# Patient Record
Sex: Female | Born: 1945 | Race: White | Hispanic: No | Smoking: Never smoker
Health system: Southern US, Community
[De-identification: ages and names within clinical notes are randomized; demographics above are authoritative.]

## PROBLEM LIST (undated history)

## (undated) DIAGNOSIS — J45909 Unspecified asthma, uncomplicated: Secondary | ICD-10-CM

## (undated) DIAGNOSIS — M8580 Other specified disorders of bone density and structure, unspecified site: Secondary | ICD-10-CM

## (undated) DIAGNOSIS — Z8619 Personal history of other infectious and parasitic diseases: Secondary | ICD-10-CM

## (undated) HISTORY — DX: Unspecified asthma, uncomplicated: J45.909

## (undated) HISTORY — DX: Personal history of other infectious and parasitic diseases: Z86.19

## (undated) HISTORY — DX: Other specified disorders of bone density and structure, unspecified site: M85.80

---

## 1983-06-08 HISTORY — PX: TUBAL LIGATION: SHX77

## 2012-11-02 ENCOUNTER — Ambulatory Visit (INDEPENDENT_AMBULATORY_CARE_PROVIDER_SITE_OTHER): Payer: BC Managed Care – PPO | Admitting: Physician Assistant

## 2012-11-02 VITALS — BP 128/84 | HR 116 | Temp 97.8°F | Resp 18 | Ht 62.0 in | Wt 172.8 lb

## 2012-11-02 DIAGNOSIS — J40 Bronchitis, not specified as acute or chronic: Secondary | ICD-10-CM

## 2012-11-02 DIAGNOSIS — J209 Acute bronchitis, unspecified: Secondary | ICD-10-CM

## 2012-11-02 MED ORDER — ALBUTEROL SULFATE HFA 108 (90 BASE) MCG/ACT IN AERS: 2.0000 | INHALATION_SPRAY | Freq: Four times a day (QID) | RESPIRATORY_TRACT | Status: DC | PRN

## 2012-11-02 MED ORDER — HYDROCOD POLST-CHLORPHEN POLST 10-8 MG/5ML PO LQCR: 5.0000 mL | Freq: Two times a day (BID) | ORAL | Status: DC

## 2012-11-02 MED ORDER — GUAIFENESIN ER 1200 MG PO TB12: 1.0000 | ORAL_TABLET | Freq: Two times a day (BID) | ORAL | Status: AC

## 2012-11-02 NOTE — Progress Notes (Signed)
   [address] [place] [address]   Phone [phone]  Subjective:    Patient ID: Breanna Miller, female    DOB: [birth year], 67 y.o.   MRN: [id number]  HPI Pt presents to clinic with 6 day h/o cold symptoms. She drove to [facility] for a graduation and got sick while she was there.  Her illnesses do not typically take this long to resolve so she is a little concerned.  She is up at night coughing.  She has lost her voice. Her ribcage is sore from all the coughing.  She is wheezing at night at time, though she does not have a diagnoses of asthma she has had to have an inhaler before.   OTC - herbal cold preps  OTC- dayquil,nyquil, zyrtec, mucinex  Review of Systems  HENT: Positive for congestion, sore throat, voice change and postnasal drip. Negative for rhinorrhea.   Respiratory: Positive for cough (mostly dry cough) and wheezing.        Objective:   Physical Exam  Vitals reviewed. Constitutional: She is oriented to person, place, and time. She appears well-developed and well-nourished.  HENT:  Head: Normocephalic and atraumatic.  Right Ear: External ear normal.  Left Ear: External ear normal.  Eyes: Conjunctivae are normal.  Neck: Normal range of motion. Neck supple.  Cardiovascular: Normal rate, regular rhythm and normal heart sounds.   No murmur heard. Pulmonary/Chest: Effort normal. She has wheezes (end expiratory wheezing at L base with forced expiration).  Lymphadenopathy:    She has no cervical adenopathy.  Neurological: She is alert and oriented to person, place, and time.  Skin: Skin is warm and dry.  Psychiatric: She has a normal mood and affect. Her behavior is normal. Judgment and thought content normal.          Assessment & Plan:  Bronchitis with bronchospasm - Plan: chlorpheniramine-HYDROcodone (TUSSIONEX PENNKINETIC ER) 10-8 MG/5ML LQCR, albuterol (PROVENTIL HFA;VENTOLIN HFA) 108 (90 BASE) MCG/ACT inhaler, Guaifenesin (MUCINEX MAXIMUM STRENGTH) 1200 MG TB12  Pt  to push fluids.  She will call me in 5 days if she is not any better we will call in an abx.  Benny Lennert PA-C 11/02/2012 5:29 PM

## 2012-11-04 ENCOUNTER — Telehealth: Payer: Self-pay

## 2012-11-04 NOTE — Telephone Encounter (Signed)
Pt was here this week w/ bronchitis. She is feeling somewhat better, but the cough med only lasts for about 6 hrs and she is only to take it every 12 hrs.  Luis Abed told her to call back if not better.

## 2012-11-05 MED ORDER — AZITHROMYCIN 250 MG PO TABS: ORAL_TABLET | ORAL | Status: DC

## 2012-11-05 NOTE — Telephone Encounter (Signed)
Please call this patient.  How often is she using the albuterol inhaler?

## 2012-11-05 NOTE — Telephone Encounter (Signed)
Spoke with patient and she is using albuterol inhaler q6 hrs.

## 2012-11-05 NOTE — Telephone Encounter (Signed)
Patient notified and voiced understanding.

## 2012-11-05 NOTE — Telephone Encounter (Signed)
She can increase the albuterol to Q4 hours. I've sent in an antibiotic that she can begin today. If she continues to have inadequate relief of the cough, she should RTC for re-evaluation.  Meds ordered this encounter  Medications  . azithromycin (ZITHROMAX) 250 MG tablet    Sig: Take 2 tabs PO x 1 dose, then 1 tab PO QD x 4 days    Dispense:  6 tablet    Refill:  0    Order Specific Question:  Supervising Provider    Answer:  DOOLITTLE, ROBERT P [[id number]]

## 2012-11-06 ENCOUNTER — Encounter: Payer: Self-pay | Admitting: Family Medicine

## 2012-11-06 ENCOUNTER — Ambulatory Visit (INDEPENDENT_AMBULATORY_CARE_PROVIDER_SITE_OTHER): Payer: BC Managed Care – PPO | Admitting: Family Medicine

## 2012-11-06 DIAGNOSIS — J45909 Unspecified asthma, uncomplicated: Secondary | ICD-10-CM

## 2012-11-06 DIAGNOSIS — J209 Acute bronchitis, unspecified: Secondary | ICD-10-CM | POA: Insufficient documentation

## 2012-11-06 DIAGNOSIS — J45901 Unspecified asthma with (acute) exacerbation: Secondary | ICD-10-CM

## 2012-11-06 DIAGNOSIS — J40 Bronchitis, not specified as acute or chronic: Secondary | ICD-10-CM

## 2012-11-06 MED ORDER — HYDROCOD POLST-CHLORPHEN POLST 10-8 MG/5ML PO LQCR: 5.0000 mL | Freq: Two times a day (BID) | ORAL | Status: DC

## 2012-11-06 MED ORDER — BENZONATATE 100 MG PO CAPS: 100.0000 mg | ORAL_CAPSULE | Freq: Three times a day (TID) | ORAL | Status: DC | PRN

## 2012-11-06 NOTE — Patient Instructions (Signed)
Good to see you today, call us with questions. Return in 3-6 months fasting for blood work and afterwards for physical. Take tessalon perls as needed throughout the day along with tussionex cough syrup. Schedule albuterol every 4-6 hours for next 3 days then may slowly taper down if improving. If not improving, call me for steroid course. Fill zpack at pharmacy.

## 2012-11-06 NOTE — Assessment & Plan Note (Signed)
Some improvement noted albeit slow. Recommended schedule albuterol every 4-6 hours for next 3 days Update [place] if not improving as expected for prednisone course. refilled tussionex and filled tessalon perls. rec pick up zpack at home.

## 2012-11-06 NOTE — Progress Notes (Signed)
Subjective:    Patient ID: Breanna Miller, female    DOB: [birth year], 67 y.o.   MRN: [id number]  HPI CC: new pt to establish  Recently moved from [place].  Recently seen by [place] with dx bronchitis with bronchospasm.  Treated with tussionex, albuterol and mucinex.  Did not improve so called back and prescribed azithromycin which she has not yet started.  Sxs ongoing for 10 days.  Persistent cough intermittently productive.  Cough worse late at night and first thing in am.  Wheezing improved with albuterol.  Improving dyspnea.  Fatigued.    H/o asthma - usually well controlled.  Notes triggered with pollen and URI.  Preventative: CPE >1 yr ago Well woman with OBGYN - would like to establish.  Last pap was 2004.  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  Past histories reviewed and updated if relevant as below. There are no active problems to display for this patient.  Past Medical History  Diagnosis Date  . Asthma   . History of chicken pox   . History of measles   . History of mumps    Past Surgical History  Procedure Laterality Date  . Tubal ligation  1985   History  Substance Use Topics  . Smoking status: Never Smoker   . Smokeless tobacco: Never Used  . Alcohol Use: Yes     Comment: Very rare   Family History  Problem Relation Age of Onset  . Heart disease Mother   . Hypertension Mother   . CAD Father 30    MI (smoker)  . Hypertension Sister   . Asthma Son     smoker  . Cancer Brother     prostate and bladder (nonsmoker)  . Asthma Father   . Alcohol abuse Father   . Diabetes Neg Hx    No Known Allergies Current Outpatient Prescriptions on File Prior to Visit  Medication Sig Dispense Refill  . albuterol (PROVENTIL HFA;VENTOLIN HFA)  108 (90 BASE) MCG/ACT inhaler Inhale 2 puffs into the lungs every 6 (six) hours as needed for wheezing.  1 Inhaler  0  . chlorpheniramine-HYDROcodone (TUSSIONEX PENNKINETIC ER) 10-8 MG/5ML LQCR Take 5 mLs by mouth every 12 (twelve) hours.  70 mL  0  . Guaifenesin (MUCINEX MAXIMUM STRENGTH) 1200 MG TB12 Take 1 tablet (1,200 mg total) by mouth 2 (two) times daily.  14 each  0  . azithromycin (ZITHROMAX) 250 MG tablet Take 2 tabs PO x 1 dose, then 1 tab PO QD x 4 days  6 tablet  0   No current facility-administered medications on file prior to visit.     Review of Systems  Constitutional: Positive for appetite change (with illness). Negative for fever (mild), chills, activity change, fatigue and unexpected weight change.  HENT: Positive for congestion, sore throat, sneezing and sinus pressure. Negative for hearing loss and neck pain.   Eyes: Negative for visual disturbance.  Respiratory: Positive for cough, shortness of breath and wheezing. Negative for chest tightness.   Cardiovascular: Negative for chest pain, palpitations and leg swelling.  Gastrointestinal: Positive for vomiting (post tusive emesis). Negative for nausea, abdominal pain, diarrhea, constipation, blood in stool and abdominal distention.  Genitourinary: Negative for hematuria and difficulty urinating.  Musculoskeletal: Negative for myalgias and arthralgias.  Skin: Negative for rash.  Neurological: Positive for headaches (with recent illness). Negative for dizziness, seizures and syncope.  Hematological: Negative for adenopathy. Does not bruise/bleed easily.  Psychiatric/Behavioral: Negative for dysphoric mood. The patient is not nervous/anxious.        Objective:   Physical Exam  Nursing note and vitals reviewed. Constitutional: She is oriented to person, place, and time. She appears well-developed and well-nourished. No distress.  HENT:  Head: Normocephalic and atraumatic.  Right Ear: External ear normal.  Left Ear:  External ear normal.  Nose: Nose normal. No mucosal edema or rhinorrhea. Right sinus exhibits no maxillary sinus tenderness and no frontal sinus tenderness. Left sinus exhibits no maxillary sinus tenderness and no frontal sinus tenderness.  Mouth/Throat: Uvula is midline, oropharynx is clear and moist and mucous membranes are normal. No oropharyngeal exudate.  Eyes: Conjunctivae and EOM are normal. Pupils are equal, round, and reactive to light. No scleral icterus.  Neck: Normal range of motion. Neck supple.  Cardiovascular: Normal rate, regular rhythm, normal heart sounds and intact distal pulses.   No murmur heard. Pulses:      Radial pulses are 2+ on the right side, and 2+ on the left side.  Pulmonary/Chest: Effort normal. No respiratory distress. She has wheezes (exp wheezing L>R). She has no rales.  Musculoskeletal: Normal range of motion. She exhibits no edema.  Lymphadenopathy:    She has no cervical adenopathy.  Neurological: She is alert and oriented to person, place, and time.  CN grossly intact, station and gait intact  Skin: Skin is warm and dry. No rash noted.  Psychiatric: She has a normal mood and affect. Her behavior is normal. Judgment and thought content normal.       Assessment & Plan:

## 2012-11-21 ENCOUNTER — Ambulatory Visit: Payer: Self-pay | Admitting: Family Medicine

## 2013-03-21 ENCOUNTER — Ambulatory Visit (INDEPENDENT_AMBULATORY_CARE_PROVIDER_SITE_OTHER): Payer: Medicare Other

## 2013-03-21 DIAGNOSIS — Z23 Encounter for immunization: Secondary | ICD-10-CM

## 2013-05-22 ENCOUNTER — Ambulatory Visit (INDEPENDENT_AMBULATORY_CARE_PROVIDER_SITE_OTHER): Payer: Medicare Other | Admitting: Family Medicine

## 2013-05-22 ENCOUNTER — Encounter: Payer: Self-pay | Admitting: Family Medicine

## 2013-05-22 VITALS — BP 148/90 | HR 86 | Temp 98.1°F | Ht 62.75 in | Wt 180.5 lb

## 2013-05-22 DIAGNOSIS — J4520 Mild intermittent asthma, uncomplicated: Secondary | ICD-10-CM | POA: Insufficient documentation

## 2013-05-22 DIAGNOSIS — J45901 Unspecified asthma with (acute) exacerbation: Secondary | ICD-10-CM

## 2013-05-22 MED ORDER — ALBUTEROL SULFATE HFA 108 (90 BASE) MCG/ACT IN AERS: 2.0000 | INHALATION_SPRAY | Freq: Four times a day (QID) | RESPIRATORY_TRACT | Status: DC | PRN

## 2013-05-22 MED ORDER — PREDNISONE 20 MG PO TABS: ORAL_TABLET | ORAL | Status: DC

## 2013-05-22 MED ORDER — ALBUTEROL SULFATE (2.5 MG/3ML) 0.083% IN NEBU
2.5000 mg | INHALATION_SOLUTION | Freq: Once | RESPIRATORY_TRACT | Status: AC
  Administered 2013-05-22: 2.5 mg via RESPIRATORY_TRACT

## 2013-05-22 MED ORDER — BENZONATATE 100 MG PO CAPS: 100.0000 mg | ORAL_CAPSULE | Freq: Three times a day (TID) | ORAL | Status: DC | PRN

## 2013-05-22 NOTE — Progress Notes (Signed)
   Subjective:    Patient ID: Breanna Miller, female    DOB: Feb 19, 1946, 67 y.o.   MRN: [id number]  HPI CC: cough   1wk h/o cough worse at night.  Overall dry cough, some rattling in chest however.  + wheezing and chest tightness.  Marked dyspnea with exertion after walking up a flight of stairs, decreased stamina.  Mild ST.    Taking nyquil as well as using albuterol nebulizer (husband's) which has helped.  No fevers/chills, abd pain, ear or tooth pain.  Sinuses not congested.  husband sick recently Did receive flu shot. No smokers at home.  Never somker H/o asthma - last flare was several months ago.    Past Medical History  Diagnosis Date  . Asthma   . History of chicken pox   . History of measles   . History of mumps      Review of Systems Per HPI    Objective:   Physical Exam  Nursing note and vitals reviewed. Constitutional: She appears well-developed and well-nourished. No distress.  HENT:  Head: Normocephalic and atraumatic.  Right Ear: Hearing, tympanic membrane, external ear and ear canal normal.  Left Ear: Hearing, tympanic membrane, external ear and ear canal normal.  Nose: Nose normal. No mucosal edema or rhinorrhea. Right sinus exhibits no maxillary sinus tenderness and no frontal sinus tenderness. Left sinus exhibits no maxillary sinus tenderness and no frontal sinus tenderness.  Mouth/Throat: Uvula is midline, oropharynx is clear and moist and mucous membranes are normal. No oropharyngeal exudate, posterior oropharyngeal edema, posterior oropharyngeal erythema or tonsillar abscesses.  Eyes: Conjunctivae and EOM are normal. Pupils are equal, round, and reactive to light. No scleral icterus.  Neck: Normal range of motion. Neck supple.  Cardiovascular: Normal rate, regular rhythm, normal heart sounds and intact distal pulses.   No murmur heard. Pulmonary/Chest: Effort normal. No respiratory distress. She has wheezes. She has no rales.  Diffuse polymelodic wheezing  throughout lungs, preserved air movement  Lymphadenopathy:    She has no cervical adenopathy.  Skin: Skin is warm and dry. No rash noted.   After neb - improved air movement, some persistent expiratory wheezing     Assessment & Plan:

## 2013-05-22 NOTE — Progress Notes (Signed)
Pre-visit discussion using our clinic review tool. No additional management support is needed unless otherwise documented below in the visit note.  

## 2013-05-22 NOTE — Assessment & Plan Note (Signed)
Doubt active infection currently, do think she has asthma exacerbation. Treat with alb neb in office, then alb inh scheduled for next 3 days along with prednisone course. Update if sxs persist or fail to improve. Consider controller med for this. Discussed importance of return for further eval/xray if sxs not improved with above treatment.

## 2013-05-22 NOTE — Patient Instructions (Signed)
I do think you have asthma exacerbation - treat with albuterol nebulizer in office. Use albuterol inhaler regularly - 2 puffs every 6 hours for 3 days then as needed Start prednisone course. Let us know if not improving with this for further evaluation.

## 2014-02-28 ENCOUNTER — Encounter: Payer: Self-pay | Admitting: Family Medicine

## 2014-02-28 ENCOUNTER — Ambulatory Visit (INDEPENDENT_AMBULATORY_CARE_PROVIDER_SITE_OTHER): Payer: Medicare Other | Admitting: Family Medicine

## 2014-02-28 VITALS — BP 128/86 | HR 94 | Temp 98.2°F | Wt 183.5 lb

## 2014-02-28 DIAGNOSIS — J45901 Unspecified asthma with (acute) exacerbation: Secondary | ICD-10-CM

## 2014-02-28 DIAGNOSIS — J4521 Mild intermittent asthma with (acute) exacerbation: Secondary | ICD-10-CM

## 2014-02-28 MED ORDER — IPRATROPIUM BROMIDE 0.02 % IN SOLN
0.5000 mg | Freq: Once | RESPIRATORY_TRACT | Status: AC
  Administered 2014-02-28: 0.5 mg via RESPIRATORY_TRACT

## 2014-02-28 MED ORDER — ALBUTEROL SULFATE HFA 108 (90 BASE) MCG/ACT IN AERS: 2.0000 | INHALATION_SPRAY | Freq: Four times a day (QID) | RESPIRATORY_TRACT | Status: AC | PRN

## 2014-02-28 MED ORDER — PREDNISONE 20 MG PO TABS: ORAL_TABLET | ORAL | Status: DC

## 2014-02-28 MED ORDER — ALBUTEROL SULFATE (2.5 MG/3ML) 0.083% IN NEBU
2.5000 mg | INHALATION_SOLUTION | Freq: Once | RESPIRATORY_TRACT | Status: AC
  Administered 2014-02-28: 2.5 mg via RESPIRATORY_TRACT

## 2014-02-28 NOTE — Progress Notes (Signed)
   BP 128/86  Pulse 94  Temp(Src) 98.2 F (36.8 C) (Oral)  Wt 183 lb 8 oz (83.235 kg)  SpO2 96%   CC: cough  Subjective:    Patient ID: Breanna Miller, female    DOB: [birth year], 68 y.o.   MRN: [id number]  HPI: Aysel Gilchrest is a 68 y.o. female presenting on 02/28/2014 for Cough and Wheezing   Sinus head cold 4 wks ago. This has progressed to chest congestion/wheezing with cough. Mildly productive cough. Trouble sleeping at night time. Decreased stamina and significant dyspnea.  No fevers/chills, ear or tooth pain, headaches.  H/o adult asthma. Using albuterol inhaler about 2-3x/day without improvement.  Tessalon perls not helping.  Steroid shot has helped in the past. +sick contacts (granddaughter in daycare).    Triggers are viral URIs, dust, weather changes.  Past Medical History  Diagnosis Date  . Asthma in adult   . History of chicken pox   . History of measles   . History of mumps     Relevant past medical, surgical, family and social history reviewed and updated as indicated.  Allergies and medications reviewed and updated. No current outpatient prescriptions on file prior to visit.   No current facility-administered medications on file prior to visit.    Review of Systems Per HPI unless specifically indicated above    Objective:    BP 128/86  Pulse 94  Temp(Src) 98.2 F (36.8 C) (Oral)  Wt 183 lb 8 oz (83.235 kg)  SpO2 96%  Physical Exam  Nursing note and vitals reviewed. Constitutional: She appears well-developed and well-nourished. No distress.  HENT:  Head: Normocephalic and atraumatic.  Right Ear: Hearing, tympanic membrane, external ear and ear canal normal.  Left Ear: Hearing, tympanic membrane, external ear and ear canal normal.  Nose: No mucosal edema or rhinorrhea. Right sinus exhibits no maxillary sinus tenderness and no frontal sinus tenderness. Left sinus exhibits no maxillary sinus tenderness and no frontal sinus tenderness.  Mouth/Throat: Uvula  is midline, oropharynx is clear and moist and mucous membranes are normal. No oropharyngeal exudate, posterior oropharyngeal edema, posterior oropharyngeal erythema or tonsillar abscesses.  Eyes: Conjunctivae and EOM are normal. Pupils are equal, round, and reactive to light. No scleral icterus.  Neck: Normal range of motion. Neck supple.  Cardiovascular: Normal rate, regular rhythm, normal heart sounds and intact distal pulses.   No murmur heard. Pulmonary/Chest: Effort normal. No respiratory distress. She has decreased breath sounds. She has wheezes (polysymphonic throughout). She has rhonchi (scattered). She has no rales.  After alb /atrovent neb, improved air movement but still with mild residual wheeze  Lymphadenopathy:    She has no cervical adenopathy.  Skin: Skin is warm and dry. No rash noted.   No results found for this or any previous visit.    Assessment & Plan:   Problem List Items Addressed This Visit   Asthma with acute exacerbation - Primary     After initial viral head cold. Alb/atrovent neb in office today with improvement noted and easier breathing. No evidence of bacterial infection today. Treat with scheduled albuterol and prednisone taper. See pt instructions    Relevant Medications      predniSONE (DELTASONE) tablet      albuterol (PROVENTIL HFA;VENTOLIN HFA) 108 (90 BASE) MCG/ACT inhaler       Follow up plan: No Follow-up on file.

## 2014-02-28 NOTE — Patient Instructions (Signed)
Albuterol/atrovent nebulizer in office today. You have an asthma exacerbation after initial head cold. Treat with prednisone taper (sent to pharmacy) and scheduled albuterol over next several days. Update if not improving as expected or any fever or worsening productive cough

## 2014-02-28 NOTE — Progress Notes (Signed)
Pre visit review using our clinic review tool, if applicable. No additional management support is needed unless otherwise documented below in the visit note. 

## 2014-02-28 NOTE — Assessment & Plan Note (Signed)
After initial viral head cold. Alb/atrovent neb in office today with improvement noted and easier breathing. No evidence of bacterial infection today. Treat with scheduled albuterol and prednisone taper. See pt instructions

## 2014-02-28 NOTE — Addendum Note (Signed)
Addended by: Shon Millet on: 02/28/2014 10:47 AM   Modules accepted: Orders

## 2014-03-05 ENCOUNTER — Telehealth: Payer: Self-pay | Admitting: Family Medicine

## 2014-03-05 NOTE — Telephone Encounter (Signed)
How is her wheezing? Is her cough productive? Is there any fever?

## 2014-03-05 NOTE — Telephone Encounter (Signed)
Patient Information:  Caller Name: Alona BeneJoyce  Phone: [phone]  Patient: Breanna Miller, Glendy  Gender: Female  DOB: 03-16-1946  Age: [age] Years  PCP: Eustaquio BoydenGutierrez, [facility](Family Practice)  Office Follow Up:  Does the office need to follow up with this patient?: Yes  Instructions For The Office: Please review note and contact pt with Provider recommendations  RN Note:  Pt was seen in office on 9/24 and given Albuterol and Prednisone.  Pt is calling to report that her symptoms have not improved.  She states that Cough is interferring with her sleep.  She is shob when just doing light work around the house.  After she has a coughing "spell" she has to sit and rest for a while because she feels shob.  OV offered and declined.  Pt states that Provider told her to call back if sxs did not improve and they would try something else.  Pt is requesting that Provider review and recommend another course of treatment.  Symptoms  Reason For Call & Symptoms: Cough ongoing  Reviewed Health History In EMR: Yes  Reviewed Medications In EMR: Yes  Reviewed Allergies In EMR: Yes  Reviewed Surgeries / Procedures: Yes  Date of Onset of Symptoms: 02/05/2014  Treatments Tried: albuterol and prensione  Treatments Tried Worked: No  Guideline(s) Used:  Cough  Disposition Per Guideline:   See Today or Tomorrow in Office  Reason For Disposition Reached:   Continuous (nonstop) coughing interferes with work or school and no improvement using cough treatment per Care Advice  Advice Given:  Call Back If:  You become worse.  Call Back If:  You become worse.  Patient Refused Recommendation:  Patient Requests Prescription  Pt request another course of treatment

## 2014-03-05 NOTE — Telephone Encounter (Signed)
Pt said that she was given a nebulizer treatment and that helped a lot but as soon as it wore off her wheezing started back. Pt said she has been using her rescue inhaler every 6 hrs but as soon as the medication wears off she is wheezing really bad. Pt said she is wheezing with exertion really bad even to walk across her home she has to stop and take a break because of the wheezing. Pt said her cough is productive at times and some times it sounds like mucus wants to come up but she can't cough it up. Pt said what she does cough up is mostly clear and she hasn't had a fever or chills

## 2014-03-05 NOTE — Telephone Encounter (Signed)
Dr Sharen HonesGutierrez out of office and message sent to Dr Milinda Antisower.

## 2014-03-05 NOTE — Telephone Encounter (Signed)
Pt advise to go to UC due to severity of wheezing. Pt was upset because she wanted Dr. Milinda Antisower to prescribe her a nebulizer, I advise pt that Dr. Milinda Antisower wants her to be eval and UC and pt because upset and hung up

## 2014-03-05 NOTE — Telephone Encounter (Signed)
Given the severity of the wheezing - I recommend that she go to urgent care now to be checked out - I do not think she should wait until tomorrow to be seen  I will cc this to her PCP as well

## 2014-03-06 NOTE — Telephone Encounter (Signed)
Noted. I see patient has upcoming appt scheduled in office.

## 2014-03-07 ENCOUNTER — Encounter: Payer: Self-pay | Admitting: Family Medicine

## 2014-03-07 ENCOUNTER — Ambulatory Visit (INDEPENDENT_AMBULATORY_CARE_PROVIDER_SITE_OTHER): Payer: Medicare Other | Admitting: Family Medicine

## 2014-03-07 VITALS — BP 126/84 | HR 103 | Temp 98.0°F | Wt 184.2 lb

## 2014-03-07 DIAGNOSIS — J209 Acute bronchitis, unspecified: Secondary | ICD-10-CM

## 2014-03-07 DIAGNOSIS — J45901 Unspecified asthma with (acute) exacerbation: Secondary | ICD-10-CM

## 2014-03-07 DIAGNOSIS — J40 Bronchitis, not specified as acute or chronic: Secondary | ICD-10-CM

## 2014-03-07 MED ORDER — METHYLPREDNISOLONE ACETATE 40 MG/ML IJ SUSP
40.0000 mg | Freq: Once | INTRAMUSCULAR | Status: AC
  Administered 2014-03-07: 40 mg via INTRAMUSCULAR

## 2014-03-07 MED ORDER — IPRATROPIUM-ALBUTEROL 0.5-2.5 (3) MG/3ML IN SOLN
3.0000 mL | Freq: Once | RESPIRATORY_TRACT | Status: AC
  Administered 2014-03-07: 3 mL via RESPIRATORY_TRACT

## 2014-03-07 MED ORDER — HYDROCOD POLST-CHLORPHEN POLST 10-8 MG/5ML PO LQCR: 5.0000 mL | Freq: Two times a day (BID) | ORAL | Status: AC

## 2014-03-07 MED ORDER — ALBUTEROL SULFATE (2.5 MG/3ML) 0.083% IN NEBU
2.5000 mg | INHALATION_SOLUTION | Freq: Once | RESPIRATORY_TRACT | Status: AC
  Administered 2014-03-07: 2.5 mg via RESPIRATORY_TRACT

## 2014-03-07 MED ORDER — IPRATROPIUM-ALBUTEROL 0.5-2.5 (3) MG/3ML IN SOLN: 3.0000 mL | Freq: Once | RESPIRATORY_TRACT | Status: DC

## 2014-03-07 NOTE — Progress Notes (Signed)
Pre visit review using our clinic review tool, if applicable. No additional management support is needed unless otherwise documented below in the visit note. 

## 2014-03-07 NOTE — Progress Notes (Signed)
Subjective:   Patient ID: Breanna Miller, female    DOB: [birth year], 68 y.o.   MRN: [id number]  Kiyoko Mcguirt is a pleasant 67 y.o. year old female pt of Dr. Reece Agar, new to me, who presents to clinic today with Cough  on 03/07/2014  HPI: Notes reviewed- was seen by Dr. Reece Agar on 9/24.  Had head cold 4 weeks prior that progressed to wheezing with cough. H/o asthma. Given duoneb in office with improvement. Given course of prednisone taper- last dose this am and recommended scheduled albuterol.  Called yesterday and reported that her symptoms had not improved and cough was interfering with sleep.  Also requested rx for nebulizer since wheezing seemed to return as soon as "albuterol INH wore off.  Also complained of DOE.  Given severity of wheezing along with DOE, Dr. Milinda Antis advised UC.  Pt did not go to UC.   Still wheezing.  Cannot sleep due to cough, mildly productive.  Does have DOE but she feels this might be getting a little better. No fevers.  Current Outpatient Prescriptions on File Prior to Visit  Medication Sig Dispense Refill  . albuterol (PROVENTIL HFA;VENTOLIN HFA) 108 (90 BASE) MCG/ACT inhaler Inhale 2 puffs into the lungs every 6 (six) hours as needed for wheezing.  1 Inhaler  11   No current facility-administered medications on file prior to visit.    No Known Allergies  Past Medical History  Diagnosis Date  . Asthma in adult   . History of chicken pox   . History of measles   . History of mumps     Past Surgical History  Procedure Laterality Date  . Tubal ligation  1985    Family History  Problem Relation Age of Onset  . Heart disease Mother   . Hypertension Mother   . CAD Father 110    MI (smoker)  . Hypertension Sister   . Asthma Son     smoker  . Cancer Brother     prostate and bladder (nonsmoker)  . Asthma Father     strong fmhx  . Alcohol abuse Father   . Diabetes Neg Hx     History   Social History  . Marital Status: Divorced    Spouse Name: N/A   Number of Children: N/A  . Years of Education: N/A   Occupational History  . Not on file.   Social History Main Topics  . Smoking status: Never Smoker   . Smokeless tobacco: Never Used  . Alcohol Use: Yes     Comment: Very rare  . Drug Use: No  . Sexual Activity: Yes   Other Topics Concern  . Not on file   Social History Narrative   Caffeine: 2-3 cups coffee/day   Lives with husband  Dorene Sorrow), 2 dogs   Occupation: retired, was Estate manager/land agent in [employer]   Edu: HS   Activity: stays active on farm ([employer])   Diet: good water, fruits/vegetables daily   The PMH, PSH, Social History, Family History, Medications, and allergies have been reviewed in [facility], and have been updated if relevant.   Review of Systems  Constitutional: Positive for fatigue. Negative for fever and chills.  HENT: Positive for congestion. Negative for sinus pressure, sneezing and sore throat.   Respiratory: Positive for cough, shortness of breath and wheezing. Negative for choking.   Gastrointestinal: Negative.   Genitourinary: Negative.   Musculoskeletal: Negative.   All other systems reviewed and are negative.  Objective:    BP 126/84  Pulse 103  Temp(Src) 98 F (36.7 C) (Oral)  Wt 184 lb 4 oz (83.575 kg)  SpO2 95%   Physical Exam  Nursing note and vitals reviewed. Constitutional: She appears well-developed and well-nourished. No distress.  HENT:  Head: Normocephalic.  Cardiovascular: Tachycardia present.   Pulmonary/Chest: Not tachypneic. No respiratory distress. She has wheezes. She has no rhonchi.  Tight- much improved air movement after duonebs treatment Diffuse wheezing, also improvement after nebs. Otherwise reassuring/unremarkable lung exam  Skin: Skin is warm, dry and intact.  Psychiatric: Her mood appears anxious.          Assessment & Plan:   Asthma with acute exacerbation, unspecified asthma severity No Follow-up on file.

## 2014-03-07 NOTE — Assessment & Plan Note (Addendum)
Persistent but non toxic appearing. Given rx for cough suppressant with codeine to use at night. IM depo Medrol (40) given today given severity of symptoms. Does need close follow up- advised calling PCP in am. CXR not done today given that her lungs otherwise clear and afebrile. The patient indicates understanding of these issues and agrees with the plan.

## 2014-03-07 NOTE — Addendum Note (Signed)
Addended by: Desmond DikeKNIGHT, Samarah Hogle H on: 03/07/2014 12:11 PM   Modules accepted: Orders

## 2014-03-07 NOTE — Patient Instructions (Signed)
Nice to meet you. Please call Dr. Reece AgarG in the morning to give him an update of your symptoms if you are not feeling better. If anything worsens over night, please go to the ER or urgent care if still open.

## 2014-05-06 LAB — PULMONARY FUNCTION TEST

## 2014-05-09 ENCOUNTER — Encounter: Payer: Self-pay | Admitting: Family Medicine

## 2014-05-27 ENCOUNTER — Ambulatory Visit (INDEPENDENT_AMBULATORY_CARE_PROVIDER_SITE_OTHER): Payer: Medicare Other

## 2014-05-27 DIAGNOSIS — Z23 Encounter for immunization: Secondary | ICD-10-CM

## 2014-07-24 ENCOUNTER — Telehealth: Payer: Self-pay | Admitting: Family Medicine

## 2014-07-24 ENCOUNTER — Ambulatory Visit (INDEPENDENT_AMBULATORY_CARE_PROVIDER_SITE_OTHER): Payer: Medicare Other | Admitting: Family Medicine

## 2014-07-24 ENCOUNTER — Encounter: Payer: Self-pay | Admitting: Family Medicine

## 2014-07-24 ENCOUNTER — Ambulatory Visit (INDEPENDENT_AMBULATORY_CARE_PROVIDER_SITE_OTHER)
Admission: RE | Admit: 2014-07-24 | Discharge: 2014-07-24 | Disposition: A | Discharge status: Home / Self Care | Payer: Medicare Other | Source: Ambulatory Visit | Attending: Family Medicine | Admitting: Family Medicine

## 2014-07-24 VITALS — BP 136/80 | HR 108 | Temp 98.1°F | Wt 185.8 lb

## 2014-07-24 DIAGNOSIS — M25561 Pain in right knee: Secondary | ICD-10-CM

## 2014-07-24 MED ORDER — NAPROXEN 500 MG PO TABS: ORAL_TABLET | ORAL | Status: DC

## 2014-07-24 NOTE — Patient Instructions (Signed)
I don't think this is a blood clot. I think you have arthritis flare of right knee, or possible meniscal injury. Xray of right knee today. Treat with continued ice or heating pad (whichever soothes better) and take prescription naprosyn anti inflammatory twice daily with food for 5 days then as needed. Once feeling better, do exercises provided today. Let us know if not improving as expected.

## 2014-07-24 NOTE — Progress Notes (Signed)
Pre visit review using our clinic review tool, if applicable. No additional management support is needed unless otherwise documented below in the visit note. 

## 2014-07-24 NOTE — Assessment & Plan Note (Signed)
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 Check xrays today to eval arthritic burden. Treat with ice/heat, nsaid, and rest. Update if not improving as expected for further eval, consider referral to sports med. Pt agrees with plan.

## 2014-07-24 NOTE — Progress Notes (Signed)
   BP 136/80 mmHg  Pulse 108  Temp(Src) 98.1 F (36.7 C) (Oral)  Wt 185 lb 12 oz (84.256 kg)   CC: R knee pain  Subjective:    Patient ID: Breanna Miller, female    DOB: 1945/07/05, 69 y.o.   MRN: [id number]  HPI: Breanna Miller is a 69 y.o. female presenting on 07/24/2014 for Knee Pain   5d h/o R knee pain and swelling. Worse pain with any position changes. Points to posterior R knee at popliteal area, along with some swelling at knee. Denies redness or warmth of knee or leg. So far has tried heat/cold, daily aleve.   Denies inciting trauma/injury or falls. Did slip in snow 1 mo ago but doesn't remember any pain at that time. Did increase stairs and increased cleaning Thursday/Friday.  Asthma - saw Dr Kathe MarinerHerbert Fleming for asthma. On Advair and singulair and doing better.   Relevant past medical, surgical, family and social history reviewed and updated as indicated. Interim medical history since our last visit reviewed. Allergies and medications reviewed and updated. Current Outpatient Prescriptions on File Prior to Visit  Medication Sig  . albuterol (PROVENTIL HFA;VENTOLIN HFA) 108 (90 BASE) MCG/ACT inhaler Inhale 2 puffs into the lungs every 6 (six) hours as needed for wheezing. (Patient not taking: Reported on 07/24/2014)   No current facility-administered medications on file prior to visit.    Review of Systems Per HPI unless specifically indicated above     Objective:    BP 136/80 mmHg  Pulse 108  Temp(Src) 98.1 F (36.7 C) (Oral)  Wt 185 lb 12 oz (84.256 kg)  Wt Readings from Last 3 Encounters:  07/24/14 185 lb 12 oz (84.256 kg)  03/07/14 184 lb 4 oz (83.575 kg)  02/28/14 183 lb 8 oz (83.235 kg)    Physical Exam  Constitutional: She appears well-developed and well-nourished. No distress.  Obese Body mass index is 33.16 kg/(m^2).   Musculoskeletal: She exhibits no edema.  R calf circ 44cm, L calf circ 44.5cm L knee WNL R Knee exam: No deformity on inspection. Pain  with palpation of popliteal area Swelling at knee joint noted. FROM in flex/extension with significant crepitus on right. Mild popliteal fullness. Neg drawer test. Tender with mcmurray test. No pain with valgus/varus stress. No PFgrind. No abnormal patellar mobility.  Nursing note and vitals reviewed.  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 Check xrays today to eval arthritic burden. Treat with ice/heat, nsaid, and rest. Update if not improving as expected for further eval, consider referral to sports med. Pt agrees with plan.      Relevant Orders   DG Knee Complete 4 Views Right       Follow up plan: Return if symptoms worsen or fail to improve.

## 2014-07-24 NOTE — Telephone Encounter (Signed)
Patient asked for you to call her about her x-ray results.

## 2014-07-25 NOTE — Telephone Encounter (Signed)
Patient advised.   Patient asks if it would be possible to do an US to rule out a clot.  She says she has never been diagnosed with arthritis in the past and would feel better to get the US.

## 2014-07-26 NOTE — Addendum Note (Signed)
Addended by: Eustaquio BoydenGUTIERREZ, Deja Kaigler on: 07/26/2014 07:26 AM   Modules accepted: Orders

## 2014-07-26 NOTE — Telephone Encounter (Signed)
Patient notified and verbalized understanding. She will think about lab work and call back if she decides to have it done.

## 2014-07-26 NOTE — Telephone Encounter (Addendum)
Exam was not consistent with DVT - no leg swelling on right, no redness or warmth. If pt remains concerned about blood clot would offer she come in for D dimer blood test which if positive would suggest DVT but if negative would effectively rule this out. I don't think she needs ultrasound. She has mild arthritis in leg but also likely baker's cyst causing fullness behind knee. Could also buy neoprene knee sleeve for further support of her knee.

## 2015-02-03 ENCOUNTER — Encounter: Payer: Self-pay | Admitting: Family Medicine

## 2015-03-03 ENCOUNTER — Telehealth: Payer: Self-pay | Admitting: *Deleted

## 2015-03-03 NOTE — Telephone Encounter (Signed)
Left message for pt to call PCP and advise if she has had Mammogram.

## 2015-08-16 DIAGNOSIS — R509 Fever, unspecified: Secondary | ICD-10-CM | POA: Diagnosis not present

## 2015-08-16 DIAGNOSIS — J069 Acute upper respiratory infection, unspecified: Secondary | ICD-10-CM | POA: Diagnosis not present

## 2015-08-27 DIAGNOSIS — J4520 Mild intermittent asthma, uncomplicated: Secondary | ICD-10-CM | POA: Diagnosis not present

## 2016-03-03 DIAGNOSIS — J4520 Mild intermittent asthma, uncomplicated: Secondary | ICD-10-CM | POA: Diagnosis not present

## 2016-04-30 DIAGNOSIS — F322 Major depressive disorder, single episode, severe without psychotic features: Secondary | ICD-10-CM | POA: Diagnosis not present

## 2016-04-30 DIAGNOSIS — I10 Essential (primary) hypertension: Secondary | ICD-10-CM | POA: Diagnosis not present

## 2016-04-30 DIAGNOSIS — R5382 Chronic fatigue, unspecified: Secondary | ICD-10-CM | POA: Diagnosis not present

## 2016-04-30 DIAGNOSIS — J4540 Moderate persistent asthma, uncomplicated: Secondary | ICD-10-CM | POA: Diagnosis not present

## 2016-09-08 DIAGNOSIS — R05 Cough: Secondary | ICD-10-CM | POA: Diagnosis not present

## 2016-09-08 DIAGNOSIS — J4520 Mild intermittent asthma, uncomplicated: Secondary | ICD-10-CM | POA: Diagnosis not present

## 2017-02-10 ENCOUNTER — Ambulatory Visit (INDEPENDENT_AMBULATORY_CARE_PROVIDER_SITE_OTHER): Payer: Medicare Other | Admitting: Family Medicine

## 2017-02-10 ENCOUNTER — Encounter: Payer: Self-pay | Admitting: Family Medicine

## 2017-02-10 VITALS — BP 124/66 | HR 78 | Temp 97.9°F | Ht 61.25 in | Wt 157.0 lb

## 2017-02-10 DIAGNOSIS — Z0000 Encounter for general adult medical examination without abnormal findings: Secondary | ICD-10-CM | POA: Insufficient documentation

## 2017-02-10 DIAGNOSIS — G4700 Insomnia, unspecified: Secondary | ICD-10-CM | POA: Insufficient documentation

## 2017-02-10 DIAGNOSIS — Z7189 Other specified counseling: Secondary | ICD-10-CM | POA: Insufficient documentation

## 2017-02-10 DIAGNOSIS — Z13220 Encounter for screening for lipoid disorders: Secondary | ICD-10-CM | POA: Diagnosis not present

## 2017-02-10 DIAGNOSIS — Z1231 Encounter for screening mammogram for malignant neoplasm of breast: Secondary | ICD-10-CM | POA: Diagnosis not present

## 2017-02-10 DIAGNOSIS — Z1239 Encounter for other screening for malignant neoplasm of breast: Secondary | ICD-10-CM

## 2017-02-10 DIAGNOSIS — F5104 Psychophysiologic insomnia: Secondary | ICD-10-CM

## 2017-02-10 DIAGNOSIS — E2839 Other primary ovarian failure: Secondary | ICD-10-CM

## 2017-02-10 DIAGNOSIS — Z1211 Encounter for screening for malignant neoplasm of colon: Secondary | ICD-10-CM | POA: Diagnosis not present

## 2017-02-10 DIAGNOSIS — I10 Essential (primary) hypertension: Secondary | ICD-10-CM

## 2017-02-10 DIAGNOSIS — J4520 Mild intermittent asthma, uncomplicated: Secondary | ICD-10-CM | POA: Diagnosis not present

## 2017-02-10 LAB — LIPID PANEL
CHOLESTEROL: 203 mg/dL — AB (ref 0–200)
HDL: 45.8 mg/dL (ref >39.00)
LDL Cholesterol: 140 mg/dL — ABNORMAL HIGH (ref 0–99)
NonHDL: 157.25
Total CHOL/HDL Ratio: 4
Triglycerides: 84 mg/dL (ref 0.0–149.0)
VLDL: 16.8 mg/dL (ref 0.0–40.0)

## 2017-02-10 LAB — BASIC METABOLIC PANEL
BUN: 21 mg/dL (ref 6–23)
CALCIUM: 9.2 mg/dL (ref 8.4–10.5)
CO2: 30 meq/L (ref 19–32)
Chloride: 104 mEq/L (ref 96–112)
Creatinine, Ser: 0.9 mg/dL (ref 0.40–1.20)
GFR: 65.96 mL/min (ref >60.00)
GLUCOSE: 98 mg/dL (ref 70–99)
Potassium: 4.1 mEq/L (ref 3.5–5.1)
SODIUM: 140 meq/L (ref 135–145)

## 2017-02-10 LAB — TSH: TSH: 2.85 u[IU]/mL (ref 0.35–4.50)

## 2017-02-10 MED ORDER — ESCITALOPRAM OXALATE 10 MG PO TABS: 10.0000 mg | ORAL_TABLET | Freq: Every day | ORAL | 3 refills | Status: DC

## 2017-02-10 MED ORDER — LORAZEPAM 0.5 MG PO TABS: 0.5000 mg | ORAL_TABLET | Freq: Every evening | ORAL | 1 refills | Status: DC | PRN

## 2017-02-10 MED ORDER — AMLODIPINE BESYLATE 2.5 MG PO TABS
2.5000 mg | ORAL_TABLET | Freq: Every day | ORAL | 3 refills | Status: DC
Start: 2017-02-10 — End: 2018-04-11

## 2017-02-10 NOTE — Patient Instructions (Addendum)
See front office to schedule mammogram and bone density scan  If interested, check with pharmacy about new 2 shot shingles series (shingrix).  Advanced directive packet provided today.  Labs today.  Pass by lab for stool kit.  Trial lorazepam 0.'5mg'$  1/2-1 tablet at bedtime as needed for sleep.   Health Maintenance, Female Adopting a healthy lifestyle and getting preventive care can go a long way to promote health and wellness. Talk with your health care provider about what schedule of regular examinations is right for you. This is a good chance for you to check in with your provider about disease prevention and staying healthy. In between checkups, there are plenty of things you can do on your own. Experts have done a lot of research about which lifestyle changes and preventive measures are most likely to keep you healthy. Ask your health care provider for more information. Weight and diet Eat a healthy diet  Be sure to include plenty of vegetables, fruits, low-fat dairy products, and lean protein.  Do not eat a lot of foods high in solid fats, added sugars, or salt.  Get regular exercise. This is one of the most important things you can do for your health. ? Most adults should exercise for at least 150 minutes each week. The exercise should increase your heart rate and make you sweat (moderate-intensity exercise). ? Most adults should also do strengthening exercises at least twice a week. This is in addition to the moderate-intensity exercise.  Maintain a healthy weight  Body mass index (BMI) is a measurement that can be used to identify possible weight problems. It estimates body fat based on height and weight. Your health care provider can help determine your BMI and help you achieve or maintain a healthy weight.  For females 52 years of age and older: ? A BMI below 18.5 is considered underweight. ? A BMI of 18.5 to 24.9 is normal. ? A BMI of 25 to 29.9 is considered overweight. ? A  BMI of 30 and above is considered obese.  Watch levels of cholesterol and blood lipids  You should start having your blood tested for lipids and cholesterol at 69 years of age, then have this test every 5 years.  You may need to have your cholesterol levels checked more often if: ? Your lipid or cholesterol levels are high. ? You are older than 69 years of age. ? You are at high risk for heart disease.  Cancer screening Lung Cancer  Lung cancer screening is recommended for adults 45-46 years old who are at high risk for lung cancer because of a history of smoking.  A yearly low-dose CT scan of the lungs is recommended for people who: ? Currently smoke. ? Have quit within the past 15 years. ? Have at least a 30-pack-year history of smoking. A pack year is smoking an average of one pack of cigarettes a day for 1 year.  Yearly screening should continue until it has been 15 years since you quit.  Yearly screening should stop if you develop a health problem that would prevent you from having lung cancer treatment.  Breast Cancer  Practice breast self-awareness. This means understanding how your breasts normally appear and feel.  It also means doing regular breast self-exams. Let your health care provider know about any changes, no matter how small.  If you are in your 20s or 30s, you should have a clinical breast exam (CBE) by a health care provider every 1-3 years as  part of a regular health exam.  If you are 40 or older, have a CBE every year. Also consider having a breast X-ray (mammogram) every year.  If you have a family history of breast cancer, talk to your health care provider about genetic screening.  If you are at high risk for breast cancer, talk to your health care provider about having an MRI and a mammogram every year.  Breast cancer gene (BRCA) assessment is recommended for women who have family members with BRCA-related cancers. BRCA-related cancers  include: ? Breast. ? Ovarian. ? Tubal. ? Peritoneal cancers.  Results of the assessment will determine the need for genetic counseling and BRCA1 and BRCA2 testing.  Cervical Cancer Your health care provider may recommend that you be screened regularly for cancer of the pelvic organs (ovaries, uterus, and vagina). This screening involves a pelvic examination, including checking for microscopic changes to the surface of your cervix (Pap test). You may be encouraged to have this screening done every 3 years, beginning at age 66.  For women ages 23-65, health care providers may recommend pelvic exams and Pap testing every 3 years, or they may recommend the Pap and pelvic exam, combined with testing for human papilloma virus (HPV), every 5 years. Some types of HPV increase your risk of cervical cancer. Testing for HPV may also be done on women of any age with unclear Pap test results.  Other health care providers may not recommend any screening for nonpregnant women who are considered low risk for pelvic cancer and who do not have symptoms. Ask your health care provider if a screening pelvic exam is right for you.  If you have had past treatment for cervical cancer or a condition that could lead to cancer, you need Pap tests and screening for cancer for at least 20 years after your treatment. If Pap tests have been discontinued, your risk factors (such as having a new sexual partner) need to be reassessed to determine if screening should resume. Some women have medical problems that increase the chance of getting cervical cancer. In these cases, your health care provider may recommend more frequent screening and Pap tests.  Colorectal Cancer  This type of cancer can be detected and often prevented.  Routine colorectal cancer screening usually begins at 69 years of age and continues through 69 years of age.  Your health care provider may recommend screening at an earlier age if you have risk factors  for colon cancer.  Your health care provider may also recommend using home test kits to check for hidden blood in the stool.  A small camera at the end of a tube can be used to examine your colon directly (sigmoidoscopy or colonoscopy). This is done to check for the earliest forms of colorectal cancer.  Routine screening usually begins at age 84.  Direct examination of the colon should be repeated every 5-10 years through 69 years of age. However, you may need to be screened more often if early forms of precancerous polyps or small growths are found.  Skin Cancer  Check your skin from head to toe regularly.  Tell your health care provider about any new moles or changes in moles, especially if there is a change in a mole's shape or color.  Also tell your health care provider if you have a mole that is larger than the size of a pencil eraser.  Always use sunscreen. Apply sunscreen liberally and repeatedly throughout the day.  Protect yourself by wearing  long sleeves, pants, a wide-brimmed hat, and sunglasses whenever you are outside.  Heart disease, diabetes, and high blood pressure  High blood pressure causes heart disease and increases the risk of stroke. High blood pressure is more likely to develop in: ? People who have blood pressure in the high end of the normal range (130-139/85-89 mm Hg). ? People who are overweight or obese. ? People who are African American.  If you are 84-53 years of age, have your blood pressure checked every 3-5 years. If you are 36 years of age or older, have your blood pressure checked every year. You should have your blood pressure measured twice-once when you are at a hospital or clinic, and once when you are not at a hospital or clinic. Record the average of the two measurements. To check your blood pressure when you are not at a hospital or clinic, you can use: ? An automated blood pressure machine at a pharmacy. ? A home blood pressure monitor.  If  you are between 81 years and 46 years old, ask your health care provider if you should take aspirin to prevent strokes.  Have regular diabetes screenings. This involves taking a blood sample to check your fasting blood sugar level. ? If you are at a normal weight and have a low risk for diabetes, have this test once every three years after 69 years of age. ? If you are overweight and have a high risk for diabetes, consider being tested at a younger age or more often. Preventing infection Hepatitis B  If you have a higher risk for hepatitis B, you should be screened for this virus. You are considered at high risk for hepatitis B if: ? You were born in a country where hepatitis B is common. Ask your health care provider which countries are considered high risk. ? Your parents were born in a high-risk country, and you have not been immunized against hepatitis B (hepatitis B vaccine). ? You have HIV or AIDS. ? You use needles to inject street drugs. ? You live with someone who has hepatitis B. ? You have had sex with someone who has hepatitis B. ? You get hemodialysis treatment. ? You take certain medicines for conditions, including cancer, organ transplantation, and autoimmune conditions.  Hepatitis C  Blood testing is recommended for: ? Everyone born from [birth year] through 1963. ? Anyone with known risk factors for hepatitis C.  Sexually transmitted infections (STIs)  You should be screened for sexually transmitted infections (STIs) including gonorrhea and chlamydia if: ? You are sexually active and are younger than 69 years of age. ? You are older than 69 years of age and your health care provider tells you that you are at risk for this type of infection. ? Your sexual activity has changed since you were last screened and you are at an increased risk for chlamydia or gonorrhea. Ask your health care provider if you are at risk.  If you do not have HIV, but are at risk, it may be recommended  that you take a prescription medicine daily to prevent HIV infection. This is called pre-exposure prophylaxis (PrEP). You are considered at risk if: ? You are sexually active and do not regularly use condoms or know the HIV status of your partner(s). ? You take drugs by injection. ? You are sexually active with a partner who has HIV.  Talk with your health care provider about whether you are at high risk of being infected with HIV.  If you choose to begin PrEP, you should first be tested for HIV. You should then be tested every 3 months for as long as you are taking PrEP. Pregnancy  If you are premenopausal and you may become pregnant, ask your health care provider about preconception counseling.  If you may become pregnant, take 400 to 800 micrograms (mcg) of folic acid every day.  If you want to prevent pregnancy, talk to your health care provider about birth control (contraception). Osteoporosis and menopause  Osteoporosis is a disease in which the bones lose minerals and strength with aging. This can result in serious bone fractures. Your risk for osteoporosis can be identified using a bone density scan.  If you are 8 years of age or older, or if you are at risk for osteoporosis and fractures, ask your health care provider if you should be screened.  Ask your health care provider whether you should take a calcium or vitamin D supplement to lower your risk for osteoporosis.  Menopause may have certain physical symptoms and risks.  Hormone replacement therapy may reduce some of these symptoms and risks. Talk to your health care provider about whether hormone replacement therapy is right for you. Follow these instructions at home:  Schedule regular health, dental, and eye exams.  Stay current with your immunizations.  Do not use any tobacco products including cigarettes, chewing tobacco, or electronic cigarettes.  If you are pregnant, do not drink alcohol.  If you are  breastfeeding, limit how much and how often you drink alcohol.  Limit alcohol intake to no more than 1 drink per day for nonpregnant women. One drink equals 12 ounces of beer, 5 ounces of wine, or 1 ounces of hard liquor.  Do not use street drugs.  Do not share needles.  Ask your health care provider for help if you need support or information about quitting drugs.  Tell your health care provider if you often feel depressed.  Tell your health care provider if you have ever been abused or do not feel safe at home. This information is not intended to replace advice given to you by your health care provider. Make sure you discuss any questions you have with your health care provider. Document Released: 12/07/2010 Document Revised: 10/30/2015 Document Reviewed: 02/25/2015 Elsevier Interactive Patient Education  [employer].

## 2017-02-10 NOTE — Assessment & Plan Note (Signed)
Advanced planning - does not have this. Family aware of her wishes. Would want sons (2nd then oldest) to be HCPOA. Packet provided today.

## 2017-02-10 NOTE — Assessment & Plan Note (Signed)
Followed by Dr Fleming.   

## 2017-02-10 NOTE — Assessment & Plan Note (Signed)
Continue amlodipine. Update labs.

## 2017-02-10 NOTE — Assessment & Plan Note (Signed)
Worsened after son's death. Discussed sleep hygiene measures.  OTC without improvement. Will trial lorazepam 1/2-1 tab at night. Discussed controlled substance, discussed habit forming, discussed would want to use as temporary measure. Left f/u open ended - return if not improving.

## 2017-02-10 NOTE — Assessment & Plan Note (Signed)
Preventative protocols reviewed and updated unless pt declined. Discussed healthy diet and lifestyle.  

## 2017-02-10 NOTE — Progress Notes (Signed)
BP 124/66   Pulse 78   Temp 97.9 F (36.6 C) (Oral)   Ht 5' 1.25" (1.556 m)   Wt 157 lb (71.2 kg)   SpO2 98%   BMI 29.42 kg/m    CC: CPE Subjective:    Patient ID: Breanna Miller, female    DOB: 1946/05/16, 71 y.o.   MRN: [id number]  HPI: Breanna Miller is a 71 y.o. female presenting on 02/10/2017 for Annual Exam (Medicare); Insomnia (following sons death); and Shoulder Pain (right)   No recent physical. Tough year - youngest son passed away from metastatic lung cancer, now oldest son battling prostate cancer. Lexapro started this past year with significant benefit. Ongoing insomnia as well. She has tried OTC melatonin and benadryl without significant improvement.  Asthma, rhinitis followed by Dr Meredeth Ide.  HTN - started on amlodipine 2.5mg  by [facility] doctor. Doing well with this.  Weight loss noted, pt attribute to stressful year with death of son.  Saw [place] doctor May 15, 2016.   Passes hearing and vision screens No falls in past year  Preventative: Colon cancer screening - discussed, would like iFOB.  Breast cancer screening - no recent mammogram Well woman - none recently. Discussed GYN cancers. Will consider and let us know if desires this.  DEXA - will order Flu shot yearly declines today prevnar - declines shingrix - discussed Advanced planning - does not have this. Family aware of her wishes. Would want sons (2nd then oldest) to be HCPOA. Packet provided today.  Seat belt use discussed Sunscreen use discussed. No changing moles on skin.  Non smoker Alcohol - social/seldom  Relevant past medical, surgical, family and social history reviewed and updated as indicated. Interim medical history since our last visit reviewed. Allergies and medications reviewed and updated. Outpatient Medications Prior to Visit  Medication Sig Dispense Refill  . albuterol (PROVENTIL HFA;VENTOLIN HFA) 108 (90 BASE) MCG/ACT inhaler Inhale 2 puffs into the lungs every 6 (six) hours as needed for wheezing. 1  Inhaler 11  . cetirizine (ZYRTEC) 10 MG tablet Take 10 mg by mouth daily.    . Fluticasone-Salmeterol (ADVAIR DISKUS IN) Inhale 1 puff into the lungs 2 (two) times daily.    . montelukast (SINGULAIR) 10 MG tablet Take 10 mg by mouth at bedtime.    . naproxen sodium (ANAPROX) 220 MG tablet Take 220 mg by mouth 2 (two) times daily with a meal.    . naproxen (NAPROSYN) 500 MG tablet Take one po bid x 5 days then prn pain, take with food 40 tablet 0   No facility-administered medications prior to visit.      Per HPI unless specifically indicated in ROS section below Review of Systems  Constitutional: Negative for activity change, appetite change, chills, fatigue, fever and unexpected weight change.  HENT: Negative for hearing loss.   Eyes: Negative for visual disturbance.  Respiratory: Negative for cough, chest tightness, shortness of breath and wheezing.   Cardiovascular: Negative for chest pain, palpitations and leg swelling.  Gastrointestinal: Negative for abdominal distention, abdominal pain, blood in stool, constipation, diarrhea, nausea and vomiting.  Genitourinary: Negative for difficulty urinating and hematuria.  Musculoskeletal: Negative for arthralgias, myalgias and neck pain.  Skin: Negative for rash.  Neurological: Positive for headaches (intermittent). Negative for dizziness, seizures and syncope.  Hematological: Negative for adenopathy. Does not bruise/bleed easily.  Psychiatric/Behavioral: Negative for dysphoric mood. The patient is not nervous/anxious.        Objective:    BP 124/66   Pulse 78  Temp 97.9 F (36.6 C) (Oral)   Ht 5' 1.25" (1.556 m)   Wt 157 lb (71.2 kg)   SpO2 98%   BMI 29.42 kg/m   Wt Readings from Last 3 Encounters:  02/10/17 157 lb (71.2 kg)  07/24/14 185 lb 12 oz (84.3 kg)  03/07/14 184 lb 4 oz (83.6 kg)    Physical Exam  Constitutional: She is oriented to person, place, and time. She appears well-developed and well-nourished. No distress.    HENT:  Head: Normocephalic and atraumatic.  Right Ear: Hearing, tympanic membrane, external ear and ear canal normal.  Left Ear: Hearing, tympanic membrane, external ear and ear canal normal.  Nose: Nose normal.  Mouth/Throat: Uvula is midline, oropharynx is clear and moist and mucous membranes are normal. No oropharyngeal exudate, posterior oropharyngeal edema or posterior oropharyngeal erythema.  Eyes: Pupils are equal, round, and reactive to light. Conjunctivae and EOM are normal. No scleral icterus.  Neck: Normal range of motion. Neck supple. Carotid bruit is not present. No thyromegaly present.  Cardiovascular: Normal rate, regular rhythm, normal heart sounds and intact distal pulses.   No murmur heard. Pulses:      Radial pulses are 2+ on the right side, and 2+ on the left side.  Pulmonary/Chest: Effort normal and breath sounds normal. No respiratory distress. She has no wheezes. She has no rales.  Abdominal: Soft. Bowel sounds are normal. She exhibits no distension and no mass. There is no tenderness. There is no rebound and no guarding.  Musculoskeletal: Normal range of motion. She exhibits no edema.  Lymphadenopathy:    She has no cervical adenopathy.  Neurological: She is alert and oriented to person, place, and time.  CN grossly intact, station and gait intact  Skin: Skin is warm and dry. No rash noted.  Psychiatric: She has a normal mood and affect. Her behavior is normal. Judgment and thought content normal.  Nursing note and vitals reviewed.  Results for orders placed or performed in visit on 05/20/14  Pulmonary Function Test  Result Value Ref Range   FEV1  liters   FVC  liters   FEV1/FVC  %   TLC  liters   DLCO  ml/mmHg sec      Assessment & Plan:   Problem List Items Addressed This Visit    Advanced care planning/counseling discussion    Advanced planning - does not have this. Family aware of her wishes. Would want sons (2nd then oldest) to be HCPOA. Packet  provided today.       Health maintenance examination - Primary    Preventative protocols reviewed and updated unless pt declined. Discussed healthy diet and lifestyle.       Hypertension    Continue amlodipine. Update labs.       Relevant Medications   amLODipine (NORVASC) 2.5 MG tablet   Other Relevant Orders   Basic metabolic panel   TSH   Insomnia    Worsened after son's death. Discussed sleep hygiene measures.  OTC without improvement. Will trial lorazepam 1/2-1 tab at night. Discussed controlled substance, discussed habit forming, discussed would want to use as temporary measure. Left f/u open ended - return if not improving.       Mild intermittent asthma    Followed by Dr Meredeth Ide.        Other Visit Diagnoses    Breast cancer screening       Relevant Orders   MM Digital Screening   Estrogen deficiency  Relevant Orders   DG Bone Density   Lipid screening       Relevant Orders   Lipid panel   Special screening for malignant neoplasms, colon       Relevant Orders   Fecal occult blood, imunochemical       Follow up plan: No Follow-up on file.  Eustaquio BoydenJavier Aloria Looper, MD

## 2017-02-10 NOTE — Addendum Note (Signed)
Addended by: Alvina ChouWALSH, TERRI J on: 02/10/2017 11:21 AM   Modules accepted: Orders

## 2017-02-13 ENCOUNTER — Encounter: Payer: Self-pay | Admitting: Family Medicine

## 2017-02-13 DIAGNOSIS — E785 Hyperlipidemia, unspecified: Secondary | ICD-10-CM | POA: Insufficient documentation

## 2017-03-09 DIAGNOSIS — Z23 Encounter for immunization: Secondary | ICD-10-CM | POA: Diagnosis not present

## 2017-03-09 DIAGNOSIS — J449 Chronic obstructive pulmonary disease, unspecified: Secondary | ICD-10-CM | POA: Diagnosis not present

## 2017-03-28 ENCOUNTER — Other Ambulatory Visit (INDEPENDENT_AMBULATORY_CARE_PROVIDER_SITE_OTHER): Payer: Medicare Other

## 2017-03-28 DIAGNOSIS — Z1211 Encounter for screening for malignant neoplasm of colon: Secondary | ICD-10-CM

## 2017-03-28 LAB — FECAL OCCULT BLOOD, IMMUNOCHEMICAL: Fecal Occult Bld: NEGATIVE

## 2017-03-28 LAB — FECAL OCCULT BLOOD, GUAIAC: FECAL OCCULT BLD: NEGATIVE

## 2017-03-31 ENCOUNTER — Encounter: Payer: Self-pay | Admitting: Family Medicine

## 2017-07-04 ENCOUNTER — Other Ambulatory Visit: Payer: Self-pay | Admitting: Family Medicine

## 2017-07-05 NOTE — Telephone Encounter (Signed)
Last filled:  05/12/17, #30 Last OV  (CPE):  02/10/17 Next OV:  02/17/18

## 2017-07-05 NOTE — Telephone Encounter (Signed)
Sent electronically 

## 2017-09-07 DIAGNOSIS — J449 Chronic obstructive pulmonary disease, unspecified: Secondary | ICD-10-CM | POA: Diagnosis not present

## 2017-09-07 DIAGNOSIS — R0602 Shortness of breath: Secondary | ICD-10-CM | POA: Diagnosis not present

## 2017-09-28 ENCOUNTER — Other Ambulatory Visit: Payer: Self-pay | Admitting: Family Medicine

## 2017-09-28 NOTE — Telephone Encounter (Signed)
Last filled:  08/01/17, #30 Last OV (CPE):  02/10/17 Next OV (CPE):  02/17/18

## 2017-09-30 NOTE — Telephone Encounter (Signed)
Eprescribed.

## 2017-12-12 ENCOUNTER — Other Ambulatory Visit: Payer: Self-pay | Admitting: Family Medicine

## 2017-12-12 NOTE — Telephone Encounter (Signed)
Name of Medication: LORazepam (ATIVAN) 0.5 MG tablet  Name of Pharmacy: [facility]   Last Fill or Written Date and Quantity: 09/30/2017 #30 with 1 refill  Last Office Visit and Type: 02/10/2017 physical appt  Next Office Visit and Type: 02/17/2018 Annual Part 2  Last Controlled Substance Agreement Date:   Last UDS:

## 2017-12-13 NOTE — Telephone Encounter (Signed)
Eprescribed.

## 2018-01-31 ENCOUNTER — Other Ambulatory Visit: Payer: Self-pay | Admitting: Family Medicine

## 2018-02-15 ENCOUNTER — Ambulatory Visit: Payer: Medicare Other

## 2018-02-15 ENCOUNTER — Other Ambulatory Visit: Payer: Self-pay | Admitting: Family Medicine

## 2018-02-15 DIAGNOSIS — E785 Hyperlipidemia, unspecified: Secondary | ICD-10-CM

## 2018-02-15 DIAGNOSIS — Z1159 Encounter for screening for other viral diseases: Secondary | ICD-10-CM

## 2018-02-17 ENCOUNTER — Encounter: Payer: Medicare Other | Admitting: Family Medicine

## 2018-03-08 DIAGNOSIS — J4520 Mild intermittent asthma, uncomplicated: Secondary | ICD-10-CM | POA: Diagnosis not present

## 2018-03-08 DIAGNOSIS — R0609 Other forms of dyspnea: Secondary | ICD-10-CM | POA: Diagnosis not present

## 2018-03-09 ENCOUNTER — Other Ambulatory Visit: Payer: Self-pay | Admitting: Family Medicine

## 2018-03-09 NOTE — Telephone Encounter (Signed)
Name of Medication: Lorazepam Name of Pharmacy: [facility] Last Fill or Written Date and Quantity: 01/30/18, #30 Last Office Visit and Type: 02/10/17, CPE Next Office Visit and Type: none Last Controlled Substance Agreement Date: none Last UDS: none

## 2018-03-11 NOTE — Telephone Encounter (Signed)
Eprescribed.

## 2018-04-06 ENCOUNTER — Ambulatory Visit (INDEPENDENT_AMBULATORY_CARE_PROVIDER_SITE_OTHER): Payer: Medicare Other

## 2018-04-06 VITALS — BP 120/80 | HR 79 | Temp 98.1°F | Ht 62.5 in | Wt 161.2 lb

## 2018-04-06 DIAGNOSIS — Z0000 Encounter for general adult medical examination without abnormal findings: Secondary | ICD-10-CM | POA: Diagnosis not present

## 2018-04-06 DIAGNOSIS — E785 Hyperlipidemia, unspecified: Secondary | ICD-10-CM

## 2018-04-06 DIAGNOSIS — Z1159 Encounter for screening for other viral diseases: Secondary | ICD-10-CM | POA: Diagnosis not present

## 2018-04-06 DIAGNOSIS — Z23 Encounter for immunization: Secondary | ICD-10-CM

## 2018-04-06 LAB — COMPREHENSIVE METABOLIC PANEL
ALT: 10 U/L (ref 0–35)
AST: 19 U/L (ref 0–37)
Albumin: 4.3 g/dL (ref 3.5–5.2)
Alkaline Phosphatase: 65 U/L (ref 39–117)
BUN: 14 mg/dL (ref 6–23)
CHLORIDE: 101 meq/L (ref 96–112)
CO2: 32 mEq/L (ref 19–32)
Calcium: 9.3 mg/dL (ref 8.4–10.5)
Creatinine, Ser: 0.87 mg/dL (ref 0.40–1.20)
GFR: 68.36 mL/min (ref >60.00)
GLUCOSE: 94 mg/dL (ref 70–99)
POTASSIUM: 4.2 meq/L (ref 3.5–5.1)
Sodium: 137 mEq/L (ref 135–145)
Total Bilirubin: 0.4 mg/dL (ref 0.2–1.2)
Total Protein: 7.3 g/dL (ref 6.0–8.3)

## 2018-04-06 LAB — LIPID PANEL
CHOL/HDL RATIO: 4
Cholesterol: 209 mg/dL — ABNORMAL HIGH (ref 0–200)
HDL: 48.6 mg/dL (ref >39.00)
LDL Cholesterol: 139 mg/dL — ABNORMAL HIGH (ref 0–99)
NONHDL: 160.41
TRIGLYCERIDES: 109 mg/dL (ref 0.0–149.0)
VLDL: 21.8 mg/dL (ref 0.0–40.0)

## 2018-04-06 NOTE — Progress Notes (Signed)
PCP notes:   Health maintenance:  Colon cancer screening - PCP please provide kit to patient PNA vaccine - PCP follow-up needed Mammogram - PCP follow-up needed Bone density - PCP follow-up needed Flu vaccine - administered  Abnormal screenings:   Hearing - failed  Hearing Screening   _0  _1  _2  _3  _4  _5  _6  _7  _8   Right ear:   40 40 40  0    Left ear:   40 40 40  40     Depression score: 2 Depression screen [facility] 2/9 04/06/2018 02/10/2017  Decreased Interest 0 2  Down, Depressed, Hopeless 1 2  PHQ - 2 Score 1 4  Altered sleeping 1 3  Tired, decreased energy 0 2  Change in appetite 0 1  Feeling bad or failure about yourself  - 0  Trouble concentrating 0 1  Moving slowly or fidgety/restless 0 0  Suicidal thoughts 0 0  PHQ-9 Score 2 11  Difficult doing work/chores Not difficult at all -   Patient concerns:   None  Nurse concerns:  None  Next PCP appt:   04/18/18 @ 1200

## 2018-04-06 NOTE — Patient Instructions (Signed)
Breanna Miller , Thank you for taking time to come for your Medicare Wellness Visit. I appreciate your ongoing commitment to your health goals. Please review the following plan we discussed and let me know if I can assist you in the future.   These are the goals we discussed: Goals    . 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        This is a list of the screening recommended for you and due dates:  Health Maintenance  Topic Date Due  . Stool Blood Test  06/06/2018*  . Pneumonia vaccines (1 of 2 - PCV13) 09/06/2018*  . Mammogram  06/07/2019*  . DEXA scan (bone density measurement)  06/07/2019*  . Colon Cancer Screening  06/07/2019*  . DTaP/Tdap/Td vaccine (1 - Tdap) 06/07/2022*  . Tetanus Vaccine  06/07/2022  . Flu Shot  Completed  .  Hepatitis C: One time screening is recommended by Center for Disease Control  (CDC) for  adults born from [birth year] through 1963.   Completed  *Topic was postponed. The date shown is not the original due date.   Preventive Care for Adults  A healthy lifestyle and preventive care can promote health and wellness. Preventive health guidelines for adults include the following key practices.  . A routine yearly physical is a good way to check with your health care provider about your health and preventive screening. It is a chance to share any concerns and updates on your health and to receive a thorough exam.  . Visit your dentist for a routine exam and preventive care every 6 months. Brush your teeth twice a day and floss once a day. Good oral hygiene prevents tooth decay and gum disease.  . The frequency of eye exams is based on your age, health, family medical history, use  of contact lenses, and other factors. Follow your health care provider's recommendations for frequency of eye exams.  . Eat a healthy diet. Foods like vegetables, fruits, whole grains, low-fat dairy products, and lean protein  foods contain the nutrients you need without too many calories. Decrease your intake of foods high in solid fats, added sugars, and salt. Eat the right amount of calories for you. Get information about a proper diet from your health care provider, if necessary.  . Regular physical exercise is one of the most important things you can do for your health. Most adults should get at least 150 minutes of moderate-intensity exercise (any activity that increases your heart rate and causes you to sweat) each week. In addition, most adults need muscle-strengthening exercises on 2 or more days a week.  Silver Sneakers may be a benefit available to you. To determine eligibility, you may visit the website: www.silversneakers.com or contact program at (202)336-1932 Mon-Fri between 8AM-8PM.   . Maintain a healthy weight. The body mass index (BMI) is a screening tool to identify possible weight problems. It provides an estimate of body fat based on height and weight. Your health care provider can find your BMI and can help you achieve or maintain a healthy weight.   For adults 20 years and older: ? A BMI below 18.5 is considered underweight. ? A BMI of 18.5 to 24.9 is normal. ? A BMI of 25 to 29.9 is considered overweight. ? A BMI of 30 and above is considered obese.   . Maintain normal blood lipids and cholesterol levels by exercising and minimizing your  intake of saturated fat. Eat a balanced diet with plenty of fruit and vegetables. Blood tests for lipids and cholesterol should begin at age 16 and be repeated every 5 years. If your lipid or cholesterol levels are high, you are over 50, or you are at high risk for heart disease, you may need your cholesterol levels checked more frequently. Ongoing high lipid and cholesterol levels should be treated with medicines if diet and exercise are not working.  . If you smoke, find out from your health care provider how to quit. If you do not use tobacco, please do not  start.  . If you choose to drink alcohol, please do not consume more than 2 drinks per day. One drink is considered to be 12 ounces (355 mL) of beer, 5 ounces (148 mL) of wine, or 1.5 ounces (44 mL) of liquor.  . If you are 68-49 years old, ask your health care provider if you should take aspirin to prevent strokes.  . Use sunscreen. Apply sunscreen liberally and repeatedly throughout the day. You should seek shade when your shadow is shorter than you. Protect yourself by wearing long sleeves, pants, a wide-brimmed hat, and sunglasses year round, whenever you are outdoors.  . Once a month, do a whole body skin exam, using a mirror to look at the skin on your back. Tell your health care provider of new moles, moles that have irregular borders, moles that are larger than a pencil eraser, or moles that have changed in shape or color.

## 2018-04-06 NOTE — Progress Notes (Signed)
Subjective:   Breanna Miller is a 72 y.o. female who presents for an Initial Medicare Annual Wellness Visit.  Review of Systems    N/A  Cardiac Risk Factors include: advanced age (>65men, >29 women);dyslipidemia;hypertension     Objective:    Today's Vitals   04/06/18 1248  BP: 120/80  Pulse: 79  Temp: 98.1 F (36.7 C)  TempSrc: Oral  SpO2: 97%  Weight: 161 lb 4 oz (73.1 kg)  Height: 5' 2.5" (1.588 m)  PainSc: 0-No pain   Body mass index is 29.02 kg/m.  Advanced Directives 04/06/2018  Does Patient Have a Medical Advance Directive? No  Would patient like information on creating a medical advance directive? Yes (MAU/Ambulatory/Procedural Areas - Information given)    Current Medications (verified) Outpatient Encounter Medications as of 04/06/2018  Medication Sig  . albuterol (PROVENTIL HFA;VENTOLIN HFA) 108 (90 BASE) MCG/ACT inhaler Inhale 2 puffs into the lungs every 6 (six) hours as needed for wheezing.  Marland Kitchen amLODipine (NORVASC) 2.5 MG tablet Take 1 tablet (2.5 mg total) by mouth daily.  Marland Kitchen escitalopram (LEXAPRO) 10 MG tablet TAKE 1 TABLET BY MOUTH DAILY.  Marland Kitchen LORazepam (ATIVAN) 0.5 MG tablet TAKE 1 TABLET BY MOUTH AT BEDTIME AS NEEDED FOR SLEEP  . montelukast (SINGULAIR) 10 MG tablet Take 10 mg by mouth at bedtime.  . [DISCONTINUED] cetirizine (ZYRTEC) 10 MG tablet Take 10 mg by mouth daily.  . [DISCONTINUED] Fluticasone-Salmeterol (ADVAIR DISKUS IN) Inhale 1 puff into the lungs 2 (two) times daily.  . [DISCONTINUED] naproxen sodium (ANAPROX) 220 MG tablet Take 220 mg by mouth 2 (two) times daily with a meal.   No facility-administered encounter medications on file as of 04/06/2018.     Allergies (verified) Patient has no known allergies.   History: Past Medical History:  Diagnosis Date  . Asthma in adult    PFTs 04/2014 - FVC 100%, FEV1 82%, ratio 0.65 -   . History of chicken pox   . History of measles   . History of mumps    Past Surgical History:  Procedure  Laterality Date  . TUBAL LIGATION  1985   Family History  Problem Relation Age of Onset  . Heart disease Mother   . Hypertension Mother   . CAD Father 105       MI (smoker)  . Asthma Father        strong fmhx  . Alcohol abuse Father   . Hypertension Sister   . Asthma Son        smoker  . Cancer Son 65       prostate  . Cancer Brother        prostate and bladder (nonsmoker)  . Cancer Son 42       lung  . Diabetes Neg Hx    Social History   Socioeconomic History  . Marital status: Divorced    Spouse name: Not on file  . Number of children: Not on file  . Years of education: Not on file  . Highest education level: Not on file  Occupational History  . Not on file  Social Needs  . Financial resource strain: Not on file  . Food insecurity:    Worry: Not on file    Inability: Not on file  . Transportation needs:    Medical: Not on file    Non-medical: Not on file  Tobacco Use  . Smoking status: Never Smoker  . Smokeless tobacco: Never Used  Substance and Sexual Activity  .  Alcohol use: Yes    Comment: Very rare  . Drug use: No  . Sexual activity: Yes  Lifestyle  . Physical activity:    Days per week: Not on file    Minutes per session: Not on file  . Stress: Not on file  Relationships  . Social connections:    Talks on phone: Not on file    Gets together: Not on file    Attends religious service: Not on file    Active member of club or organization: Not on file    Attends meetings of clubs or organizations: Not on file    Relationship status: Not on file  Other Topics Concern  . Not on file  Social History Narrative   Caffeine: 2-3 cups coffee/day   Lives with husband  Breanna Miller), 2 dogs   Occupation: retired, was Estate manager/land agent in [employer]   Edu: HS   Activity: stays active on farm Lawyer)   Diet: good water, fruits/vegetables daily    Tobacco Counseling Counseling given: No   Clinical Intake:  Pre-visit preparation completed:  Yes  Pain : No/denies pain Pain Score: 0-No pain     Nutritional Status: BMI 25 -29 Overweight Nutritional Risks: None Diabetes: No  How often do you need to have someone help you when you read instructions, pamphlets, or other written materials from your doctor or pharmacy?: 1 - Never What is the last grade level you completed in school?: 12th grade  Interpreter Needed?: No  Comments: pt lives with significant other Information entered by :: LPinson, LPN   Activities of Daily Living In your present state of health, do you have any difficulty performing the following activities: 04/06/2018  Hearing? N  Vision? N  Difficulty concentrating or making decisions? N  Walking or climbing stairs? N  Dressing or bathing? N  Doing errands, shopping? N  Preparing Food and eating ? N  Using the Toilet? N  In the past six months, have you accidently leaked urine? N  Do you have problems with loss of bowel control? N  Managing your Medications? N  Managing your Finances? N  Housekeeping or managing your Housekeeping? N  Some recent data might be hidden     Immunizations and Health Maintenance Immunization History  Administered Date(s) Administered  . Influenza,inj,Quad PF,6+ Mos 03/21/2013, 05/27/2014, 04/06/2018  . Influenza-Unspecified 03/09/2017  . Td 06/07/2012   There are no preventive care reminders to display for this patient.  Patient Care Team: Eustaquio Boyden, MD as PCP - General (Family Medicine)  Indicate any recent Medical Services you may have received from other than [facility] providers in the past year (date may be approximate).     Assessment:   This is a routine wellness examination for Breanna Miller.  Hearing/Vision screen  Hearing Screening   125Hz  250Hz  500Hz  1000Hz  2000Hz  3000Hz  4000Hz  6000Hz  8000Hz   Right ear:   40 40 40  0    Left ear:   40 40 40  40      Visual Acuity Screening   Right eye Left eye Both eyes  Without correction: 20/30-1 20/40 20/30   With correction:       Dietary issues and exercise activities discussed: Current Exercise Habits: The patient has a physically strenous job, but has no regular exercise apart from work., Exercise limited by: None identified  Goals    . 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  Depression Screen PHQ 2/9 Scores 04/06/2018 02/10/2017  PHQ - 2 Score 1 4  PHQ- 9 Score 2 11    Fall Risk Fall Risk  04/06/2018 02/10/2017 02/10/2017  Falls in the past year? No No No   Cognitive Function: MMSE - Mini Mental State Exam 04/06/2018  Orientation to time 5  Orientation to Place 5  Registration 3  Attention/ Calculation 0  Recall 3  Language- name 2 objects 0  Language- repeat 1  Language- follow 3 step command 3  Language- read & follow direction 0  Write a sentence 0  Copy design 0  Total score 20     PLEASE NOTE: A Mini-Cog screen was completed. Maximum score is 20. A value of 0 denotes this part of Folstein MMSE was not completed or the patient failed this part of the Mini-Cog screening.   Mini-Cog Screening Orientation to Time - Max 5 pts Orientation to Place - Max 5 pts Registration - Max 3 pts Recall - Max 3 pts Language Repeat - Max 1 pts Language Follow 3 Step Command - Max 3 pts     Screening Tests Health Maintenance  Topic Date Due  . COLON CANCER SCREENING ANNUAL FOBT  06/06/2018 (Originally 03/28/2018)  . PNA vac Low Risk Adult (1 of 2 - PCV13) 09/06/2018 (Originally 01/01/2013)  . MAMMOGRAM  06/07/2019 (Originally 01/01/1998)  . DEXA SCAN  06/07/2019 (Originally 01/01/2013)  . COLONOSCOPY  06/07/2019 (Originally 01/01/1998)  . DTaP/Tdap/Td (1 - Tdap) 06/07/2022 (Originally 06/08/2012)  . TETANUS/TDAP  06/07/2022  . INFLUENZA VACCINE  Completed  . Hepatitis C Screening  Completed     Plan:   I have personally reviewed, addressed, and noted the following in the patient's chart:  A. Medical and  social history B. Use of alcohol, tobacco or illicit drugs  C. Current medications and supplements D. Functional ability and status E.  Nutritional status F.  Physical activity G. Advance directives H. List of other physicians I.  Hospitalizations, surgeries, and ER visits in previous 12 months J.  Vitals K. Screenings to include hearing, vision, cognitive, depression L. Referrals and appointments - none  In addition, I have reviewed and discussed with patient certain preventive protocols, quality metrics, and best practice recommendations. A written personalized care plan for preventive services as well as general preventive health recommendations were provided to patient.  See attached scanned questionnaire for additional information.   Signed,   Randa Evens, MHA, BS, LPN Health Coach

## 2018-04-07 LAB — HEPATITIS C ANTIBODY
Hepatitis C Ab: NONREACTIVE
SIGNAL TO CUT-OFF: 0.01 (ref <1.00)

## 2018-04-09 NOTE — Progress Notes (Signed)
I reviewed health advisor's note, was available for consultation on the day of service listed in this note, and agree with documentation and plan. Albertine Lafoy, MD.   

## 2018-04-11 ENCOUNTER — Other Ambulatory Visit: Payer: Self-pay | Admitting: Family Medicine

## 2018-04-18 ENCOUNTER — Encounter: Payer: Self-pay | Admitting: Family Medicine

## 2018-04-18 ENCOUNTER — Encounter: Payer: Medicare Other | Admitting: Family Medicine

## 2018-04-18 DIAGNOSIS — Z0289 Encounter for other administrative examinations: Secondary | ICD-10-CM

## 2018-04-18 NOTE — Assessment & Plan Note (Deleted)
Preventative protocols reviewed and updated unless pt declined. Discussed healthy diet and lifestyle.  

## 2018-04-18 NOTE — Progress Notes (Deleted)
There were no vitals taken for this visit.   CC: CPE Subjective:    Patient ID: Breanna Miller, female    DOB: 02-12-1946, 72 y.o.   MRN: [id number]  HPI: Breanna HintJoyce Ishida is a 72 y.o. female presenting on 04/18/2018 for No chief complaint on file.   Saw Lesia last week for medicare wellness visit. Note reviewed.    Preventative: Colon cancer screening - discussed, would like iFOB.  Breast cancer screening - no recent mammogram Well woman - none recently. Discussed GYN cancers. Will consider and let us know if desires this.  DEXA - will order Flu shot yearly declines today prevnar - declines shingrix - discussed Advanced planning - does not have this. Family aware of her wishes. Would want sons (2nd then oldest) to be HCPOA. Packet provided today.  Seat belt use discussed Sunscreen use discussed. No changing moles on skin.  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 Interim medical history since our last visit reviewed. Allergies and medications reviewed and updated. Outpatient Medications Prior to Visit  Medication Sig Dispense Refill  . albuterol (PROVENTIL HFA;VENTOLIN HFA) 108 (90 BASE) MCG/ACT inhaler Inhale 2 puffs into the lungs every 6 (six) hours as needed for wheezing. 1 Inhaler 11  . amLODipine (NORVASC) 2.5 MG tablet TAKE 1 TABLET BY MOUTH DAILY. 90 tablet 0  . escitalopram (LEXAPRO) 10 MG tablet TAKE 1 TABLET BY MOUTH DAILY. 90 tablet 0  . LORazepam (ATIVAN) 0.5 MG tablet TAKE 1 TABLET BY MOUTH AT BEDTIME AS NEEDED FOR SLEEP 30 tablet 1  . montelukast (SINGULAIR) 10 MG tablet Take 10 mg by mouth at bedtime.     No facility-administered medications prior to visit.       Per HPI unless specifically indicated in ROS section below Review of Systems  Constitutional: Negative for activity change, appetite change, chills, fatigue, fever and unexpected weight change.  HENT: Negative for hearing loss.   Eyes: Negative for visual disturbance.  Respiratory: Negative for cough, chest tightness, shortness of breath and wheezing.   Cardiovascular: Negative for chest pain, palpitations and leg swelling.  Gastrointestinal: Negative for abdominal distention, abdominal pain, blood in stool, constipation, diarrhea, nausea and vomiting.  Genitourinary: Negative for difficulty urinating and hematuria.  Musculoskeletal: Negative for arthralgias, myalgias and neck pain.  Skin: Negative for rash.  Neurological: Negative for dizziness, seizures, syncope and headaches.  Hematological: Negative for adenopathy. Does not bruise/bleed easily.  Psychiatric/Behavioral: Negative for dysphoric mood. The patient is not nervous/anxious.        Objective:    There were no vitals taken for this visit.  Wt Readings from Last 3 Encounters:  04/06/18 161 lb 4 oz (73.1 kg)  02/10/17 157 lb (71.2 kg)  07/24/14 185 lb 12 oz (84.3 kg)    Physical Exam  Constitutional: She is oriented to person, place, and time. She appears well-developed and well-nourished. No distress.  HENT:  Head: Normocephalic and atraumatic.  Right Ear: Hearing, tympanic membrane, external ear and ear canal normal.  Left Ear: Hearing, tympanic membrane, external ear and ear canal normal.  Nose: Nose normal.  Mouth/Throat: Uvula is midline, oropharynx is clear and moist and mucous membranes are normal. No oropharyngeal exudate, posterior oropharyngeal edema or posterior oropharyngeal  erythema.  Eyes: Pupils are equal, round, and reactive to light. Conjunctivae and EOM are normal. No scleral icterus.  Neck: Normal range of motion. Neck supple.  Cardiovascular: Normal rate, regular rhythm, normal heart sounds and  intact distal pulses.  No murmur heard. Pulses:      Radial pulses are 2+ on the right side, and 2+ on the left side.  Pulmonary/Chest: Effort normal and breath sounds normal. No respiratory distress. She has no wheezes. She has no rales.  Abdominal: Soft. Bowel sounds are normal. She exhibits no distension and no mass. There is no tenderness. There is no rebound and no guarding.  Musculoskeletal: Normal range of motion. She exhibits no edema.  Lymphadenopathy:    She has no cervical adenopathy.  Neurological: She is alert and oriented to person, place, and time.  CN grossly intact, station and gait intact  Skin: Skin is warm and dry. No rash noted.  Psychiatric: She has a normal mood and affect. Her behavior is normal. Judgment and thought content normal.  Nursing note and vitals reviewed.  Results for orders placed or performed in visit on 04/06/18  Hepatitis C antibody  Result Value Ref Range   Hepatitis C Ab NON-REACTIVE NON-REACTI   SIGNAL TO CUT-OFF 0.01 <1.00  Comprehensive metabolic panel  Result Value Ref Range   Sodium 137 135 - 145 mEq/L   Potassium 4.2 3.5 - 5.1 mEq/L   Chloride 101 96 - 112 mEq/L   CO2 32 19 - 32 mEq/L   Glucose, Bld 94 70 - 99 mg/dL   BUN 14 6 - 23 mg/dL   Creatinine, Ser 1.61 0.40 - 1.20 mg/dL   Total Bilirubin 0.4 0.2 - 1.2 mg/dL   Alkaline Phosphatase 65 39 - 117 U/L   AST 19 0 - 37 U/L   ALT 10 0 - 35 U/L   Total Protein 7.3 6.0 - 8.3 g/dL   Albumin 4.3 3.5 - 5.2 g/dL   Calcium 9.3 8.4 - 09.6 mg/dL   GFR 04.54 >09.81 mL/min  Lipid panel  Result Value Ref Range   Cholesterol 209 (H) 0 - 200 mg/dL   Triglycerides 191.4 0.0 - 149.0 mg/dL   HDL 78.29 >56.21 mg/dL   VLDL 30.8 0.0 - 65.7 mg/dL   LDL Cholesterol 846 (H) 0 - 99 mg/dL   Total CHOL/HDL Ratio 4    NonHDL 160.41       Assessment & Plan:   Problem List Items Addressed This Visit    None       No orders of the defined types were placed in this encounter.  No orders of the  defined types were placed in this encounter.   Follow up plan: No follow-ups on file.  Eustaquio Boyden, MD

## 2018-05-09 ENCOUNTER — Other Ambulatory Visit: Payer: Self-pay | Admitting: Family Medicine

## 2018-05-09 NOTE — Telephone Encounter (Signed)
Name of Medication: Lorazepam Name of Pharmacy: [facility] Last Fill or Written Date and Quantity: 04/11/18, #30/1 Last Office Visit and Type: 02/10/17, CPE Next Office Visit and Type: none Last Controlled Substance Agreement Date: none Last UDS: none

## 2018-05-10 ENCOUNTER — Telehealth: Payer: Self-pay | Admitting: Family Medicine

## 2018-05-10 NOTE — Telephone Encounter (Signed)
Can place in open 30 min slot (I seem to have several this month)

## 2018-05-10 NOTE — Telephone Encounter (Signed)
Pt called office to reschedule for her physical. Pt seen Breanna Miller for her awv on 04/06/18. Dr.Gutierrez does not have any availability for a physical until March of 2020. Please advise if we can get her in sooner or if she needs a follow up to continue to get her meds.

## 2018-05-10 NOTE — Telephone Encounter (Signed)
Eprescribed.

## 2018-05-11 NOTE — Telephone Encounter (Signed)
Appointment 12/16 pt aware

## 2018-05-21 ENCOUNTER — Encounter: Payer: Self-pay | Admitting: Family Medicine

## 2018-05-21 NOTE — Assessment & Plan Note (Signed)
Preventative protocols reviewed and updated unless pt declined. Discussed healthy diet and lifestyle.  

## 2018-05-21 NOTE — Progress Notes (Signed)
BP 140/84 (BP Location: Right Arm, Patient Position: Sitting, Cuff Size: Normal)   Pulse 85   Temp 98.6 F (37 C) (Oral)   Ht 5' 2.5" (1.588 m)   Wt 160 lb 4 oz (72.7 kg)   SpO2 95%   BMI 28.84 kg/m    CC: CPE Subjective:    Patient ID: Breanna Miller, female    DOB: [birth year], 72 y.o.   MRN: [id number]  HPI: Breanna Miller is a 72 y.o. female presenting on 05/22/2018 for Annual Exam (Pt 2. )   Youngest son passed away from metastatic lung cancer 07/2016, now oldest son battling prostate cancer.   Saw Virl AxeLesia 03/2018 for medicare wellness visit. Note reviewed.   Preventative: Colon cancer screening - iFOB normal 03/2017.  Breast cancer screening - no recent mammogram. [facility].  Well woman - none recently in last 10 yrs. Discussed GYN cancers. Will consider and let us know if desires this.  DEXA - will order  Flu shot yearly  Td 2014  Pneumovax - today  Shingrix - discussed, declines  Advanced planning - does not have this. Family aware of her wishes. Would want sons (2nd then oldest) to be HCPOA. Packet provided today.  Seat belt use discussed  Sunscreen use discussed. No changing moles on skin.  Non smoker.  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 Interim medical history since our last visit reviewed. Allergies and medications reviewed and updated. Outpatient Medications Prior to Visit  Medication Sig Dispense Refill  . albuterol (PROVENTIL HFA;VENTOLIN HFA) 108 (90 BASE) MCG/ACT inhaler Inhale 2 puffs into the lungs every 6 (six) hours as needed for wheezing. 1 Inhaler 11  . LORazepam (ATIVAN) 0.5 MG tablet TAKE 1 TABLET BY MOUTH AT  BEDTIME AS NEEDED FOR SLEEP 30 tablet 1  . montelukast (SINGULAIR) 10 MG tablet Take 10 mg by mouth at bedtime.    Marland Kitchen. amLODipine (NORVASC) 2.5 MG tablet TAKE 1 TABLET BY MOUTH DAILY. 90 tablet 0  . escitalopram (LEXAPRO) 10 MG tablet TAKE 1 TABLET BY MOUTH DAILY. 90 tablet 0   No facility-administered medications prior to visit.      Per HPI unless specifically indicated in ROS section below Review of Systems  Constitutional: Negative for activity change, appetite change, chills, fatigue, fever and unexpected weight change.  HENT: Negative for hearing loss.        Clenches teeth at night  Eyes: Negative for visual disturbance.  Respiratory: Negative for cough, chest tightness, shortness of breath and wheezing.   Cardiovascular: Negative for chest pain, palpitations and leg swelling.  Gastrointestinal: Negative for abdominal distention, abdominal pain, blood in stool, constipation, diarrhea, nausea and vomiting.  Genitourinary: Negative for difficulty urinating and hematuria.  Musculoskeletal: Negative for arthralgias, myalgias and neck pain.  Skin: Negative for rash.  Neurological: Negative for dizziness, seizures, syncope and headaches.  Hematological: Negative for adenopathy. Does not bruise/bleed easily.  Psychiatric/Behavioral: Negative for dysphoric mood. The patient is not nervous/anxious.        Objective:    BP 140/84 (BP Location: Right Arm, Patient Position: Sitting, Cuff Size: Normal)   Pulse 85   Temp 98.6 F (37 C) (Oral)   Ht 5'  2.5" (1.588 m)   Wt 160 lb 4 oz (72.7 kg)   SpO2 95%   BMI 28.84 kg/m   Wt Readings from Last 3 Encounters:  05/22/18 160 lb 4 oz (72.7 kg)  04/06/18 161 lb 4 oz (73.1 kg)  02/10/17 157 lb (71.2 kg)    Physical Exam Vitals signs and nursing note reviewed.  Constitutional:      General: She is not in acute distress.    Appearance: She is well-developed.  HENT:     Head: Normocephalic and atraumatic.     Right Ear: Hearing, tympanic  membrane, ear canal and external ear normal.     Left Ear: Hearing, tympanic membrane, ear canal and external ear normal.     Nose: Nose normal.     Mouth/Throat:     Pharynx: Uvula midline. No oropharyngeal exudate or posterior oropharyngeal erythema.  Eyes:     General: No scleral icterus.    Conjunctiva/sclera: Conjunctivae normal.     Pupils: Pupils are equal, round, and reactive to light.  Neck:     Musculoskeletal: Normal range of motion and neck supple.     Vascular: No carotid bruit.  Cardiovascular:     Rate and Rhythm: Normal rate and regular rhythm.     Pulses:          Radial pulses are 2+ on the right side and 2+ on the left side.     Heart sounds: Normal heart sounds. No murmur.  Pulmonary:     Effort: Pulmonary effort is normal. No respiratory distress.     Breath sounds: Normal breath sounds. No wheezing or rales.  Abdominal:     General: Bowel sounds are normal. There is no distension.     Palpations: Abdomen is soft. There is no mass.     Tenderness: There is no abdominal tenderness. There is no guarding or rebound.     Hernia: No hernia is present.  Musculoskeletal: Normal range of motion.        General: No swelling.  Lymphadenopathy:     Cervical: No cervical adenopathy.  Skin:    General: Skin is warm and dry.     Findings: No rash.  Neurological:     Mental Status: She is alert and oriented to person, place, and time.     Comments: CN grossly intact, station and gait intact  Psychiatric:        Behavior: Behavior normal.        Thought Content: Thought content normal.        Judgment: Judgment normal.    Results for orders placed or performed in visit on 04/06/18  Hepatitis C antibody  Result Value Ref Range   Hepatitis C Ab NON-REACTIVE NON-REACTI   SIGNAL TO CUT-OFF 0.01 <1.00  Comprehensive metabolic panel  Result Value Ref Range   Sodium 137 135 - 145 mEq/L   Potassium 4.2 3.5 - 5.1 mEq/L   Chloride 101 96 - 112 mEq/L   CO2 32 19 - 32  mEq/L   Glucose, Bld 94 70 - 99 mg/dL   BUN 14 6 - 23 mg/dL   Creatinine, Ser 4.54 0.40 - 1.20 mg/dL   Total Bilirubin 0.4 0.2 - 1.2 mg/dL   Alkaline Phosphatase 65 39 - 117 U/L   AST 19 0 - 37 U/L   ALT 10 0 - 35 U/L   Total Protein 7.3 6.0 - 8.3 g/dL   Albumin 4.3 3.5 - 5.2 g/dL   Calcium 9.3  8.4 - 10.5 mg/dL   GFR 16.10 >96.04 mL/min  Lipid panel  Result Value Ref Range   Cholesterol 209 (H) 0 - 200 mg/dL   Triglycerides 540.9 0.0 - 149.0 mg/dL   HDL 81.19 >14.78 mg/dL   VLDL 29.5 0.0 - 62.1 mg/dL   LDL Cholesterol 308 (H) 0 - 99 mg/dL   Total CHOL/HDL Ratio 4    NonHDL 160.41       Assessment & Plan:   Problem List Items Addressed This Visit    Mild intermittent asthma    Sees Dr Meredeth Ide, now off controller medication besides singulair.       Hypertension    Chronic, stable. Continue current regimen.       Relevant Medications   amLODipine (NORVASC) 2.5 MG tablet   Hyperlipidemia, mild    Chronic, stable off meds. Discussed ASCVD 10 yr risk and offered medication. She would prefer to work on healthy low cholesterol diet to help decrease cardiovascular risk.  The 10-year ASCVD risk score Denman George DC Montez Hageman., et al., 2013) is: 15.4%   Values used to calculate the score:     Age: 38 years     Sex: Female     Is Non-Hispanic African American: No     Diabetic: No     Tobacco smoker: No     Systolic Blood Pressure: 140 mmHg     Is BP treated: Yes     HDL Cholesterol: 48.6 mg/dL     Total Cholesterol: 209 mg/dL       Relevant Medications   amLODipine (NORVASC) 2.5 MG tablet   Health maintenance examination - Primary    Preventative protocols reviewed and updated unless pt declined. Discussed healthy diet and lifestyle.       Advanced care planning/counseling discussion    Advanced planning - does not have this. Family aware of her wishes. Would want sons (2nd then oldest) to be HCPOA. Packet provided today.       Adjustment disorder    On lexapro - will further  assess next visit.        Other Visit Diagnoses    Need for vaccination with 13-polyvalent pneumococcal conjugate vaccine       Relevant Orders   Pneumococcal conjugate vaccine 13-valent IM (Completed)   Special screening for malignant neoplasms, colon       Relevant Orders   Fecal occult blood, imunochemical   Breast cancer screening       Relevant Orders   MM Digital Screening   Estrogen deficiency       Relevant Orders   DG Bone Density       Meds ordered this encounter  Medications  . amLODipine (NORVASC) 2.5 MG tablet    Sig: Take 1 tablet (2.5 mg total) by mouth daily.    Dispense:  90 tablet    Refill:  3  . escitalopram (LEXAPRO) 10 MG tablet    Sig: Take 1 tablet (10 mg total) by mouth daily.    Dispense:  90 tablet    Refill:  3   Orders Placed This Encounter  Procedures  . Fecal occult blood, imunochemical    Standing Status:   Future    Standing Expiration Date:   05/23/2019  . MM Digital Screening    Standing Status:   Future    Standing Expiration Date:   07/24/2019    Order Specific Question:   Reason for Exam (SYMPTOM  OR DIAGNOSIS REQUIRED)    Answer:  breast cancer screening    Order Specific Question:   Preferred imaging location?    Answer:   [facility]  . DG Bone Density    Standing Status:   Future    Standing Expiration Date:   07/24/2019    Order Specific Question:   Reason for Exam (SYMPTOM  OR DIAGNOSIS REQUIRED)    Answer:   osteoporosis screening    Order Specific Question:   Preferred imaging location?    Answer:   [facility]  . Pneumococcal conjugate vaccine 13-valent IM    Follow up plan: Return in about 1 year (around 05/23/2019) for annual exam, prior fasting for blood work, medicare wellness visit.  Eustaquio Boyden, MD

## 2018-05-22 ENCOUNTER — Ambulatory Visit (INDEPENDENT_AMBULATORY_CARE_PROVIDER_SITE_OTHER): Payer: Medicare Other | Admitting: Family Medicine

## 2018-05-22 ENCOUNTER — Encounter: Payer: Self-pay | Admitting: Family Medicine

## 2018-05-22 ENCOUNTER — Telehealth: Payer: Self-pay | Admitting: Family Medicine

## 2018-05-22 VITALS — BP 140/84 | HR 85 | Temp 98.6°F | Ht 62.5 in | Wt 160.2 lb

## 2018-05-22 DIAGNOSIS — E2839 Other primary ovarian failure: Secondary | ICD-10-CM

## 2018-05-22 DIAGNOSIS — E785 Hyperlipidemia, unspecified: Secondary | ICD-10-CM

## 2018-05-22 DIAGNOSIS — I10 Essential (primary) hypertension: Secondary | ICD-10-CM

## 2018-05-22 DIAGNOSIS — Z1211 Encounter for screening for malignant neoplasm of colon: Secondary | ICD-10-CM

## 2018-05-22 DIAGNOSIS — Z1239 Encounter for other screening for malignant neoplasm of breast: Secondary | ICD-10-CM | POA: Diagnosis not present

## 2018-05-22 DIAGNOSIS — Z0000 Encounter for general adult medical examination without abnormal findings: Secondary | ICD-10-CM

## 2018-05-22 DIAGNOSIS — Z7189 Other specified counseling: Secondary | ICD-10-CM

## 2018-05-22 DIAGNOSIS — Z23 Encounter for immunization: Secondary | ICD-10-CM

## 2018-05-22 DIAGNOSIS — J4520 Mild intermittent asthma, uncomplicated: Secondary | ICD-10-CM

## 2018-05-22 DIAGNOSIS — F4320 Adjustment disorder, unspecified: Secondary | ICD-10-CM

## 2018-05-22 MED ORDER — AMLODIPINE BESYLATE 2.5 MG PO TABS: 2.5000 mg | ORAL_TABLET | Freq: Every day | ORAL | 3 refills | Status: DC

## 2018-05-22 MED ORDER — ESCITALOPRAM OXALATE 10 MG PO TABS: 10.0000 mg | ORAL_TABLET | Freq: Every day | ORAL | 3 refills | Status: DC

## 2018-05-22 NOTE — Telephone Encounter (Signed)
[place] needs mammogram changed to [id number] before we can schedule

## 2018-05-22 NOTE — Assessment & Plan Note (Signed)
Sees Dr Meredeth IdeFleming, now off controller medication besides singulair.

## 2018-05-22 NOTE — Telephone Encounter (Signed)
Order changed. Thanks!

## 2018-05-22 NOTE — Assessment & Plan Note (Signed)
Advanced planning - does not have this. Family aware of her wishes. Would want sons (2nd then oldest) to be HCPOA. Packet provided today.  

## 2018-05-22 NOTE — Patient Instructions (Addendum)
Prevnar today.  Pass by lab to pick up stool kit.  Advanced directive packet provided today.  We will order screening mammogram and bone density scan at [facility]).  You are doing well today.  Return as needed or in 1 year for next wellness visit and physical.   Health Maintenance, Female Adopting a healthy lifestyle and getting preventive care can go a long way to promote health and wellness. Talk with your health care provider about what schedule of regular examinations is right for you. This is a good chance for you to check in with your provider about disease prevention and staying healthy. In between checkups, there are plenty of things you can do on your own. Experts have done a lot of research about which lifestyle changes and preventive measures are most likely to keep you healthy. Ask your health care provider for more information. Weight and diet Eat a healthy diet  Be sure to include plenty of vegetables, fruits, low-fat dairy products, and lean protein.  Do not eat a lot of foods high in solid fats, added sugars, or salt.  Get regular exercise. This is one of the most important things you can do for your health. ? Most adults should exercise for at least 150 minutes each week. The exercise should increase your heart rate and make you sweat (moderate-intensity exercise). ? Most adults should also do strengthening exercises at least twice a week. This is in addition to the moderate-intensity exercise.  Maintain a healthy weight  Body mass index (BMI) is a measurement that can be used to identify possible weight problems. It estimates body fat based on height and weight. Your health care provider can help determine your BMI and help you achieve or maintain a healthy weight.  For females 7 years of age and older: ? A BMI below 18.5 is considered underweight. ? A BMI of 18.5 to 24.9 is normal. ? A BMI of 25 to 29.9 is considered overweight. ? A BMI of 30 and  above is considered obese.  Watch levels of cholesterol and blood lipids  You should start having your blood tested for lipids and cholesterol at 70 years of age, then have this test every 5 years.  You may need to have your cholesterol levels checked more often if: ? Your lipid or cholesterol levels are high. ? You are older than 70 years of age. ? You are at high risk for heart disease.  Cancer screening Lung Cancer  Lung cancer screening is recommended for adults 38-60 years old who are at high risk for lung cancer because of a history of smoking.  A yearly low-dose CT scan of the lungs is recommended for people who: ? Currently smoke. ? Have quit within the past 15 years. ? Have at least a 30-pack-year history of smoking. A pack year is smoking an average of one pack of cigarettes a day for 1 year.  Yearly screening should continue until it has been 15 years since you quit.  Yearly screening should stop if you develop a health problem that would prevent you from having lung cancer treatment.  Breast Cancer  Practice breast self-awareness. This means understanding how your breasts normally appear and feel.  It also means doing regular breast self-exams. Let your health care provider know about any changes, no matter how small.  If you are in your 20s or 30s, you should have a clinical breast exam (CBE) by a health care provider every 1-3 years  as part of a regular health exam.  If you are 78 or older, have a CBE every year. Also consider having a breast X-ray (mammogram) every year.  If you have a family history of breast cancer, talk to your health care provider about genetic screening.  If you are at high risk for breast cancer, talk to your health care provider about having an MRI and a mammogram every year.  Breast cancer gene (BRCA) assessment is recommended for women who have family members with BRCA-related cancers. BRCA-related cancers  include: ? Breast. ? Ovarian. ? Tubal. ? Peritoneal cancers.  Results of the assessment will determine the need for genetic counseling and BRCA1 and BRCA2 testing.  Cervical Cancer Your health care provider may recommend that you be screened regularly for cancer of the pelvic organs (ovaries, uterus, and vagina). This screening involves a pelvic examination, including checking for microscopic changes to the surface of your cervix (Pap test). You may be encouraged to have this screening done every 3 years, beginning at age 86.  For women ages 33-65, health care providers may recommend pelvic exams and Pap testing every 3 years, or they may recommend the Pap and pelvic exam, combined with testing for human papilloma virus (HPV), every 5 years. Some types of HPV increase your risk of cervical cancer. Testing for HPV may also be done on women of any age with unclear Pap test results.  Other health care providers may not recommend any screening for nonpregnant women who are considered low risk for pelvic cancer and who do not have symptoms. Ask your health care provider if a screening pelvic exam is right for you.  If you have had past treatment for cervical cancer or a condition that could lead to cancer, you need Pap tests and screening for cancer for at least 20 years after your treatment. If Pap tests have been discontinued, your risk factors (such as having a new sexual partner) need to be reassessed to determine if screening should resume. Some women have medical problems that increase the chance of getting cervical cancer. In these cases, your health care provider may recommend more frequent screening and Pap tests.  Colorectal Cancer  This type of cancer can be detected and often prevented.  Routine colorectal cancer screening usually begins at 70 years of age and continues through 70 years of age.  Your health care provider may recommend screening at an earlier age if you have risk factors  for colon cancer.  Your health care provider may also recommend using home test kits to check for hidden blood in the stool.  A small camera at the end of a tube can be used to examine your colon directly (sigmoidoscopy or colonoscopy). This is done to check for the earliest forms of colorectal cancer.  Routine screening usually begins at age 51.  Direct examination of the colon should be repeated every 5-10 years through 70 years of age. However, you may need to be screened more often if early forms of precancerous polyps or small growths are found.  Skin Cancer  Check your skin from head to toe regularly.  Tell your health care provider about any new moles or changes in moles, especially if there is a change in a mole's shape or color.  Also tell your health care provider if you have a mole that is larger than the size of a pencil eraser.  Always use sunscreen. Apply sunscreen liberally and repeatedly throughout the day.  Protect yourself by  wearing long sleeves, pants, a wide-brimmed hat, and sunglasses whenever you are outside.  Heart disease, diabetes, and high blood pressure  High blood pressure causes heart disease and increases the risk of stroke. High blood pressure is more likely to develop in: ? People who have blood pressure in the high end of the normal range (130-139/85-89 mm Hg). ? People who are overweight or obese. ? People who are African American.  If you are 48-51 years of age, have your blood pressure checked every 3-5 years. If you are 17 years of age or older, have your blood pressure checked every year. You should have your blood pressure measured twice-once when you are at a hospital or clinic, and once when you are not at a hospital or clinic. Record the average of the two measurements. To check your blood pressure when you are not at a hospital or clinic, you can use: ? An automated blood pressure machine at a pharmacy. ? A home blood pressure monitor.  If  you are between 67 years and 58 years old, ask your health care provider if you should take aspirin to prevent strokes.  Have regular diabetes screenings. This involves taking a blood sample to check your fasting blood sugar level. ? If you are at a normal weight and have a low risk for diabetes, have this test once every three years after 70 years of age. ? If you are overweight and have a high risk for diabetes, consider being tested at a younger age or more often. Preventing infection Hepatitis B  If you have a higher risk for hepatitis B, you should be screened for this virus. You are considered at high risk for hepatitis B if: ? You were born in a country where hepatitis B is common. Ask your health care provider which countries are considered high risk. ? Your parents were born in a high-risk country, and you have not been immunized against hepatitis B (hepatitis B vaccine). ? You have HIV or AIDS. ? You use needles to inject street drugs. ? You live with someone who has hepatitis B. ? You have had sex with someone who has hepatitis B. ? You get hemodialysis treatment. ? You take certain medicines for conditions, including cancer, organ transplantation, and autoimmune conditions.  Hepatitis C  Blood testing is recommended for: ? Everyone born from [birth year] through 1963. ? Anyone with known risk factors for hepatitis C.  Sexually transmitted infections (STIs)  You should be screened for sexually transmitted infections (STIs) including gonorrhea and chlamydia if: ? You are sexually active and are younger than 70 years of age. ? You are older than 70 years of age and your health care provider tells you that you are at risk for this type of infection. ? Your sexual activity has changed since you were last screened and you are at an increased risk for chlamydia or gonorrhea. Ask your health care provider if you are at risk.  If you do not have HIV, but are at risk, it may be recommended  that you take a prescription medicine daily to prevent HIV infection. This is called pre-exposure prophylaxis (PrEP). You are considered at risk if: ? You are sexually active and do not regularly use condoms or know the HIV status of your partner(s). ? You take drugs by injection. ? You are sexually active with a partner who has HIV.  Talk with your health care provider about whether you are at high risk of being infected with  HIV. If you choose to begin PrEP, you should first be tested for HIV. You should then be tested every 3 months for as long as you are taking PrEP. Pregnancy  If you are premenopausal and you may become pregnant, ask your health care provider about preconception counseling.  If you may become pregnant, take 400 to 800 micrograms (mcg) of folic acid every day.  If you want to prevent pregnancy, talk to your health care provider about birth control (contraception). Osteoporosis and menopause  Osteoporosis is a disease in which the bones lose minerals and strength with aging. This can result in serious bone fractures. Your risk for osteoporosis can be identified using a bone density scan.  If you are 21 years of age or older, or if you are at risk for osteoporosis and fractures, ask your health care provider if you should be screened.  Ask your health care provider whether you should take a calcium or vitamin D supplement to lower your risk for osteoporosis.  Menopause may have certain physical symptoms and risks.  Hormone replacement therapy may reduce some of these symptoms and risks. Talk to your health care provider about whether hormone replacement therapy is right for you. Follow these instructions at home:  Schedule regular health, dental, and eye exams.  Stay current with your immunizations.  Do not use any tobacco products including cigarettes, chewing tobacco, or electronic cigarettes.  If you are pregnant, do not drink alcohol.  If you are  breastfeeding, limit how much and how often you drink alcohol.  Limit alcohol intake to no more than 1 drink per day for nonpregnant women. One drink equals 12 ounces of beer, 5 ounces of wine, or 1 ounces of hard liquor.  Do not use street drugs.  Do not share needles.  Ask your health care provider for help if you need support or information about quitting drugs.  Tell your health care provider if you often feel depressed.  Tell your health care provider if you have ever been abused or do not feel safe at home. This information is not intended to replace advice given to you by your health care provider. Make sure you discuss any questions you have with your health care provider. Document Released: 12/07/2010 Document Revised: 10/30/2015 Document Reviewed: 02/25/2015 Elsevier Interactive Patient Education  [employer].

## 2018-05-22 NOTE — Addendum Note (Signed)
Addended by: Eustaquio BoydenGUTIERREZ, Kastin Cerda on: 05/22/2018 05:05 PM   Modules accepted: Orders

## 2018-05-22 NOTE — Assessment & Plan Note (Signed)
Chronic, stable. Continue current regimen. 

## 2018-05-22 NOTE — Assessment & Plan Note (Addendum)
Chronic, stable off meds. Discussed ASCVD 10 yr risk and offered medication. She would prefer to work on healthy low cholesterol diet to help decrease cardiovascular risk.  The 10-year ASCVD risk score Denman George(Goff DC Montez HagemanJr., et al., 2013) is: 15.4%   Values used to calculate the score:     Age: [age] years     Sex: Female     Is Non-Hispanic African American: No     Diabetic: No     Tobacco smoker: No     Systolic Blood Pressure: 140 mmHg     Is BP treated: Yes     HDL Cholesterol: 48.6 mg/dL     Total Cholesterol: 209 mg/dL

## 2018-05-22 NOTE — Assessment & Plan Note (Signed)
On lexapro - will further assess next visit.

## 2018-06-26 ENCOUNTER — Other Ambulatory Visit (INDEPENDENT_AMBULATORY_CARE_PROVIDER_SITE_OTHER): Payer: Medicare Other

## 2018-06-26 DIAGNOSIS — Z1211 Encounter for screening for malignant neoplasm of colon: Secondary | ICD-10-CM | POA: Diagnosis not present

## 2018-06-26 LAB — FECAL OCCULT BLOOD, IMMUNOCHEMICAL: Fecal Occult Bld: NEGATIVE

## 2018-06-26 LAB — FECAL OCCULT BLOOD, GUAIAC: Fecal Occult Blood: NEGATIVE

## 2018-06-27 ENCOUNTER — Encounter: Payer: Self-pay | Admitting: Family Medicine

## 2018-07-10 ENCOUNTER — Ambulatory Visit
Admission: RE | Admit: 2018-07-10 | Discharge: 2018-07-10 | Disposition: A | Discharge status: Home / Self Care | Payer: Medicare Other | Source: Ambulatory Visit | Attending: Family Medicine | Admitting: Family Medicine

## 2018-07-10 ENCOUNTER — Other Ambulatory Visit: Payer: Self-pay | Admitting: Family Medicine

## 2018-07-10 DIAGNOSIS — M8589 Other specified disorders of bone density and structure, multiple sites: Secondary | ICD-10-CM | POA: Insufficient documentation

## 2018-07-10 DIAGNOSIS — R928 Other abnormal and inconclusive findings on diagnostic imaging of breast: Secondary | ICD-10-CM | POA: Diagnosis not present

## 2018-07-10 DIAGNOSIS — Z1239 Encounter for other screening for malignant neoplasm of breast: Secondary | ICD-10-CM

## 2018-07-10 DIAGNOSIS — Z1231 Encounter for screening mammogram for malignant neoplasm of breast: Secondary | ICD-10-CM | POA: Insufficient documentation

## 2018-07-10 DIAGNOSIS — E2839 Other primary ovarian failure: Secondary | ICD-10-CM

## 2018-07-10 NOTE — Telephone Encounter (Signed)
Name of Medication: Lorazepam Name of Pharmacy: [facility] Last Fill or Written Date and Quantity: 06/08/18, #30/1 Last Office Visit and Type: 05/22/18, CPE Next Office Visit and Type: 05/29/19, CPE Last Controlled Substance Agreement Date: none Last UDS: none

## 2018-07-11 ENCOUNTER — Encounter: Payer: Self-pay | Admitting: Family Medicine

## 2018-07-11 ENCOUNTER — Other Ambulatory Visit: Payer: Self-pay | Admitting: Family Medicine

## 2018-07-11 DIAGNOSIS — N6489 Other specified disorders of breast: Secondary | ICD-10-CM

## 2018-07-11 DIAGNOSIS — M8580 Other specified disorders of bone density and structure, unspecified site: Secondary | ICD-10-CM

## 2018-07-11 DIAGNOSIS — R928 Other abnormal and inconclusive findings on diagnostic imaging of breast: Secondary | ICD-10-CM

## 2018-07-11 HISTORY — DX: Other specified disorders of bone density and structure, unspecified site: M85.80

## 2018-07-12 ENCOUNTER — Telehealth: Payer: Self-pay | Admitting: Family Medicine

## 2018-07-12 NOTE — Telephone Encounter (Signed)
Spoke with patient tonight. She has not received call from [facility] to schedule f/u mammogram.  Can we make sure later this week appt has been scheduled for f/u breast imaging? Thanks.

## 2018-07-12 NOTE — Telephone Encounter (Signed)
Eprescribed.

## 2018-07-14 NOTE — Telephone Encounter (Signed)
Spoke with crystal @ [facility] to schedule appointment.  She stated that Breanna Miller would be the one make this appointment for the pt.  And she has left for the day.   I spoke with pt she is aware Breanna Miller @ [facility] will contact her to schedule appointment and that Breanna Miller has left for the day

## 2018-07-14 NOTE — Telephone Encounter (Signed)
Routing to Robin

## 2018-07-17 NOTE — Telephone Encounter (Signed)
Spoke with Breanna Miller  She stated pt is on the list for her to call today

## 2018-07-20 ENCOUNTER — Ambulatory Visit
Admission: RE | Admit: 2018-07-20 | Discharge: 2018-07-20 | Disposition: A | Discharge status: Home / Self Care | Payer: Medicare Other | Source: Ambulatory Visit | Attending: Family Medicine | Admitting: Family Medicine

## 2018-07-20 DIAGNOSIS — R928 Other abnormal and inconclusive findings on diagnostic imaging of breast: Secondary | ICD-10-CM | POA: Diagnosis not present

## 2018-07-20 DIAGNOSIS — N6489 Other specified disorders of breast: Secondary | ICD-10-CM

## 2018-07-20 LAB — HM MAMMOGRAPHY

## 2018-07-21 ENCOUNTER — Encounter: Payer: Self-pay | Admitting: Family Medicine

## 2018-09-13 DIAGNOSIS — J449 Chronic obstructive pulmonary disease, unspecified: Secondary | ICD-10-CM | POA: Diagnosis not present

## 2018-10-10 ENCOUNTER — Other Ambulatory Visit: Payer: Self-pay | Admitting: Family Medicine

## 2018-10-10 NOTE — Telephone Encounter (Signed)
Name of Medication: Lorazepam Name of Pharmacy: [facility] Last Fill or Written Date and Quantity: 08/16/18, #30 Last Office Visit and Type: 05/22/18, CPE Pt 2 Next Office Visit and Type: 05/29/19, CPE Pt 2 Last Controlled Substance Agreement Date: none Last UDS: none

## 2018-10-12 NOTE — Telephone Encounter (Signed)
Eprescribed.

## 2018-12-19 ENCOUNTER — Ambulatory Visit (INDEPENDENT_AMBULATORY_CARE_PROVIDER_SITE_OTHER): Payer: Medicare Other | Admitting: Family Medicine

## 2018-12-19 ENCOUNTER — Encounter: Payer: Self-pay | Admitting: Family Medicine

## 2018-12-19 DIAGNOSIS — R21 Rash and other nonspecific skin eruption: Secondary | ICD-10-CM | POA: Insufficient documentation

## 2018-12-19 MED ORDER — TRIAMCINOLONE ACETONIDE 0.1 % EX CREA: 1.0000 "application " | TOPICAL_CREAM | Freq: Two times a day (BID) | CUTANEOUS | 1 refills | Status: DC

## 2018-12-19 NOTE — Progress Notes (Signed)
Virtual visit completed through [employer]. Due to national recommendations of social distancing due to COVID-19, a virtual visit is felt to be most appropriate for this patient at this time. Reviewed limitations of a virtual visit.   [place]  Provider location: [place] at [facility], office If any vitals were documented, they were collected by patient at home unless specified below.    Ht 5' 2.5" (1.588 m)   Wt 157 lb (71.2 kg)   BMI 28.26 kg/m    CC: R leg spot Subjective:    Patient ID: Breanna Miller, female    DOB: Aug 13, 1945, 73 y.o.   MRN: [id number]  HPI: Breanna Miller is a 73 y.o. female presenting on 12/19/2018 for Skin Lesion (C/o spot on right leg near ankle. Noticed wks ago. Says area is painful, itchy, blistered and red.  Says area is spreading. Thought initially it was ringworm. )   At their home in [place].  Rash to R lateral ankle present for weeks. Has been treating as ringworm with antifungal cream without much benefit. Tends to flare up (red, inflamed) then settle down. Desquamates. Itchy and tender when peeling.   Has another similar patch on buttock.   No new lotions, detergents, soaps or shampoos.  No new medicines, supplements, foods.   Long term on amlodipine, lexapro, singulair.  No h/o psoriasis.      Relevant past medical, surgical, family and social history reviewed and updated as indicated. Interim medical history since our last visit reviewed. Allergies and medications reviewed and updated. Outpatient Medications Prior to Visit  Medication Sig Dispense Refill  . albuterol (PROVENTIL HFA;VENTOLIN HFA) 108 (90 BASE) MCG/ACT inhaler Inhale 2 puffs into the lungs every 6 (six) hours as needed for wheezing. 1 Inhaler 11  . amLODipine (NORVASC) 2.5 MG tablet Take 1 tablet (2.5 mg total) by mouth daily. 90 tablet 3  . escitalopram (LEXAPRO) 10 MG tablet Take 1 tablet (10 mg total) by mouth daily. 90 tablet 3  . LORazepam (ATIVAN) 0.5 MG  tablet TAKE 1 TABLET BY MOUTH AT BEDTIME AS NEEDED FOR SLEEP 30 tablet 1  . montelukast (SINGULAIR) 10 MG tablet Take 10 mg by mouth at bedtime.     No facility-administered medications prior to visit.      Per HPI unless specifically indicated in ROS section below Review of Systems Objective:    Ht 5' 2.5" (1.588 m)   Wt 157 lb (71.2 kg)   BMI 28.26 kg/m   Wt Readings from Last 3 Encounters:  12/19/18 157 lb (71.2 kg)  05/22/18 160 lb 4 oz (72.7 kg)  04/06/18 161 lb 4 oz (73.1 kg)     Physical exam: Gen: alert, NAD, not ill appearing Pulm: speaks in complete sentences without increased work of breathing Psych: normal mood, normal thought content  Skin: large patch of dry scaly erythematous skin R anterior lower shin, second smaller area developing above first patch. Well demarkated erythema/patch.      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 Will treat with triamcinolone cream BID x 2 wks, discussed regular moisturizer use. If worsening or not improving with treatment, advised let us know for dermatology referral. Pt  agrees with plan.           Meds ordered this encounter  Medications  . triamcinolone cream (KENALOG) 0.1 %    Sig: Apply 1 application topically 2 (two) times daily. Apply to AA.    Dispense:  80 g    Refill:  1   No orders of the defined types were placed in this encounter.   I discussed the assessment and treatment plan with the patient. The patient was provided an opportunity to ask questions and all were answered. The patient agreed with the plan and demonstrated an understanding of the instructions. The patient was advised to call back or seek an in-person evaluation if the  symptoms worsen or if the condition fails to improve as anticipated.  Follow up plan: Return if symptoms worsen or fail to improve.  Eustaquio BoydenJavier Zorian Gunderman, MD

## 2018-12-19 NOTE — Assessment & Plan Note (Signed)
?  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 Will treat with triamcinolone cream BID x 2 wks, discussed regular moisturizer use. If worsening or not improving with treatment, advised let us know for dermatology referral. Pt agrees with plan.

## 2018-12-25 ENCOUNTER — Encounter: Payer: Self-pay | Admitting: Family Medicine

## 2018-12-27 ENCOUNTER — Other Ambulatory Visit: Payer: Self-pay | Admitting: Family Medicine

## 2018-12-27 NOTE — Telephone Encounter (Signed)
Name of Medication: Lorazepam Name of Pharmacy: [place] or Written Date and Quantity: 10/12/18, #30 x 1rf Last Office Visit and Type: 05/22/18, CPE Pt 2 Next Office Visit and Type: 05/29/19, CPE Pt 2 Last Controlled Substance Agreement Date: none Last UDS: none

## 2018-12-27 NOTE — Telephone Encounter (Signed)
Eprescribed.

## 2019-01-25 ENCOUNTER — Other Ambulatory Visit: Payer: Self-pay

## 2019-01-25 DIAGNOSIS — Z20822 Contact with and (suspected) exposure to covid-19: Secondary | ICD-10-CM

## 2019-01-26 LAB — NOVEL CORONAVIRUS, NAA: SARS-CoV-2, NAA: NOT DETECTED

## 2019-02-19 ENCOUNTER — Encounter: Payer: Self-pay | Admitting: Family Medicine

## 2019-02-21 MED ORDER — TRIAMCINOLONE ACETONIDE 0.1 % EX CREA: 1.0000 "application " | TOPICAL_CREAM | Freq: Two times a day (BID) | CUTANEOUS | 1 refills | Status: AC

## 2019-02-21 NOTE — Addendum Note (Signed)
Addended by: Ria Bush on: 02/21/2019 10:00 AM   Modules accepted: Orders

## 2019-04-04 ENCOUNTER — Other Ambulatory Visit: Payer: Self-pay | Admitting: Family Medicine

## 2019-04-04 DIAGNOSIS — R0600 Dyspnea, unspecified: Secondary | ICD-10-CM | POA: Diagnosis not present

## 2019-04-04 DIAGNOSIS — J449 Chronic obstructive pulmonary disease, unspecified: Secondary | ICD-10-CM | POA: Diagnosis not present

## 2019-04-04 NOTE — Telephone Encounter (Signed)
ERx 

## 2019-04-11 ENCOUNTER — Ambulatory Visit: Payer: Medicare Other

## 2019-04-25 ENCOUNTER — Encounter: Payer: Medicare Other | Admitting: Family Medicine

## 2019-05-23 ENCOUNTER — Other Ambulatory Visit: Payer: Self-pay | Admitting: Family Medicine

## 2019-05-23 DIAGNOSIS — I10 Essential (primary) hypertension: Secondary | ICD-10-CM

## 2019-05-23 DIAGNOSIS — E785 Hyperlipidemia, unspecified: Secondary | ICD-10-CM

## 2019-05-23 DIAGNOSIS — R21 Rash and other nonspecific skin eruption: Secondary | ICD-10-CM

## 2019-05-23 NOTE — Addendum Note (Signed)
Addended by: Ria Bush on: 05/23/2019 11:16 AM   Modules accepted: Orders

## 2019-05-24 ENCOUNTER — Ambulatory Visit: Payer: Medicare Other

## 2019-05-24 ENCOUNTER — Other Ambulatory Visit: Payer: Medicare Other

## 2019-05-29 ENCOUNTER — Encounter: Payer: Medicare Other | Admitting: Family Medicine

## 2019-06-06 ENCOUNTER — Ambulatory Visit: Payer: Medicare Other | Attending: Internal Medicine

## 2019-06-06 DIAGNOSIS — Z20828 Contact with and (suspected) exposure to other viral communicable diseases: Secondary | ICD-10-CM | POA: Diagnosis not present

## 2019-06-06 DIAGNOSIS — Z20822 Contact with and (suspected) exposure to covid-19: Secondary | ICD-10-CM

## 2019-06-07 LAB — NOVEL CORONAVIRUS, NAA: SARS-CoV-2, NAA: NOT DETECTED

## 2019-06-11 ENCOUNTER — Ambulatory Visit: Payer: Medicare Other

## 2019-06-13 ENCOUNTER — Encounter: Payer: Medicare Other | Admitting: Family Medicine

## 2019-07-16 ENCOUNTER — Other Ambulatory Visit: Payer: Self-pay | Admitting: Family Medicine

## 2019-07-16 IMAGING — MG DIGITAL SCREENING BILATERAL MAMMOGRAM WITH TOMO AND CAD
8 series · 8 of 24 positions shown · non-contrast
Comparison: None.

CLINICAL DATA: Screening.

EXAM:
DIGITAL SCREENING BILATERAL MAMMOGRAM WITH TOMO AND CAD

[L CC synth-2D]
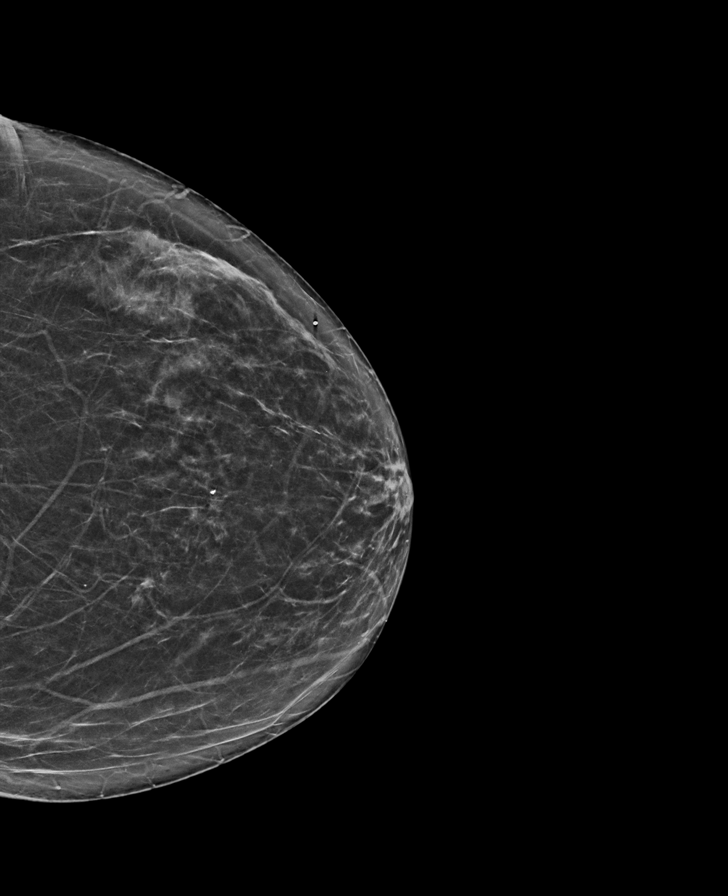

[R MLO synth-2D]
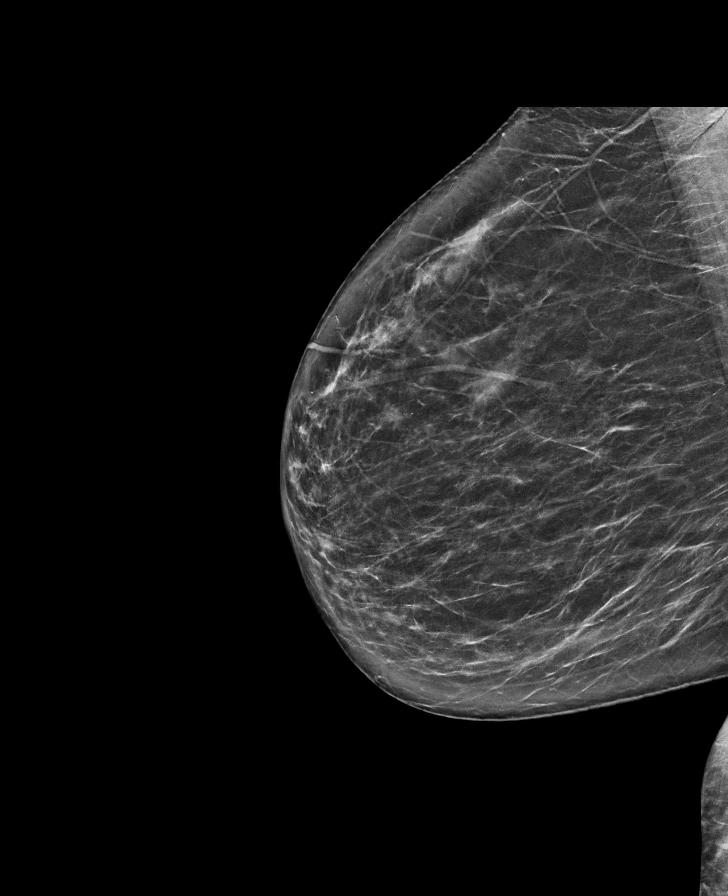

[L MLO synth-2D]
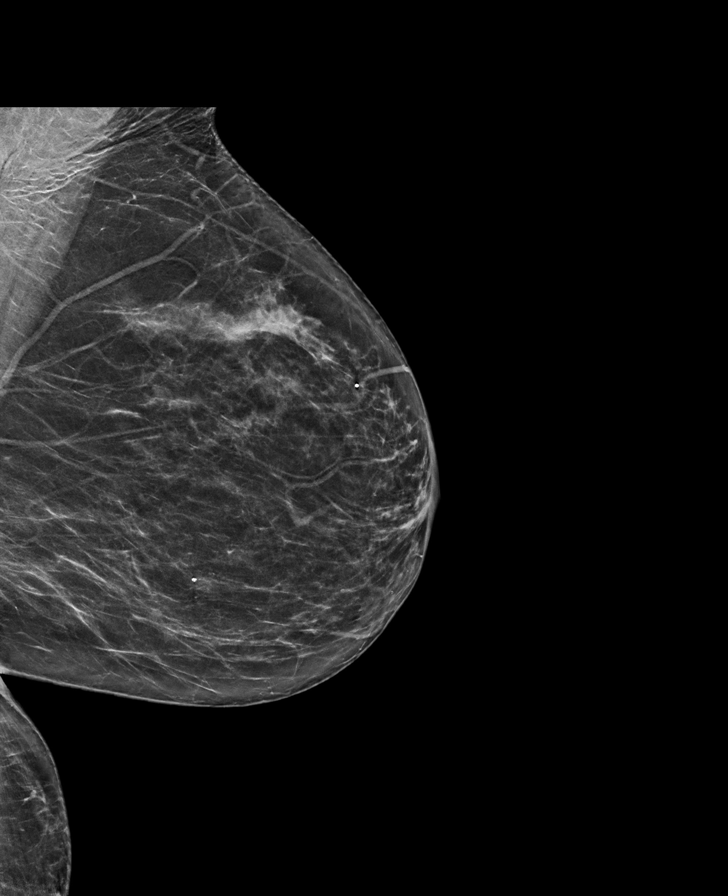

[R CC synth-2D]
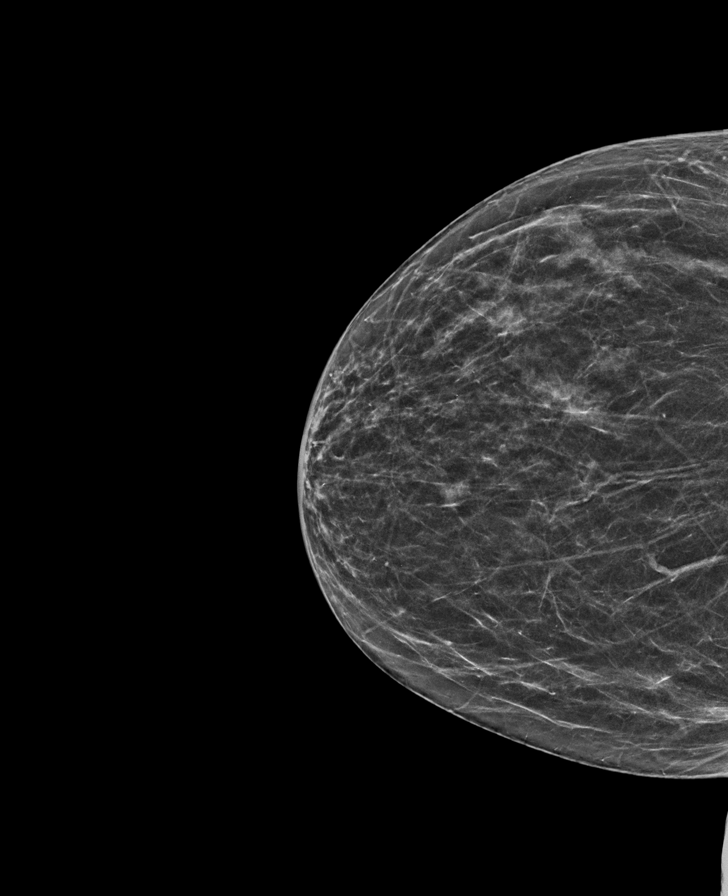

[L CC tomo · tomo slice 36/71.0]
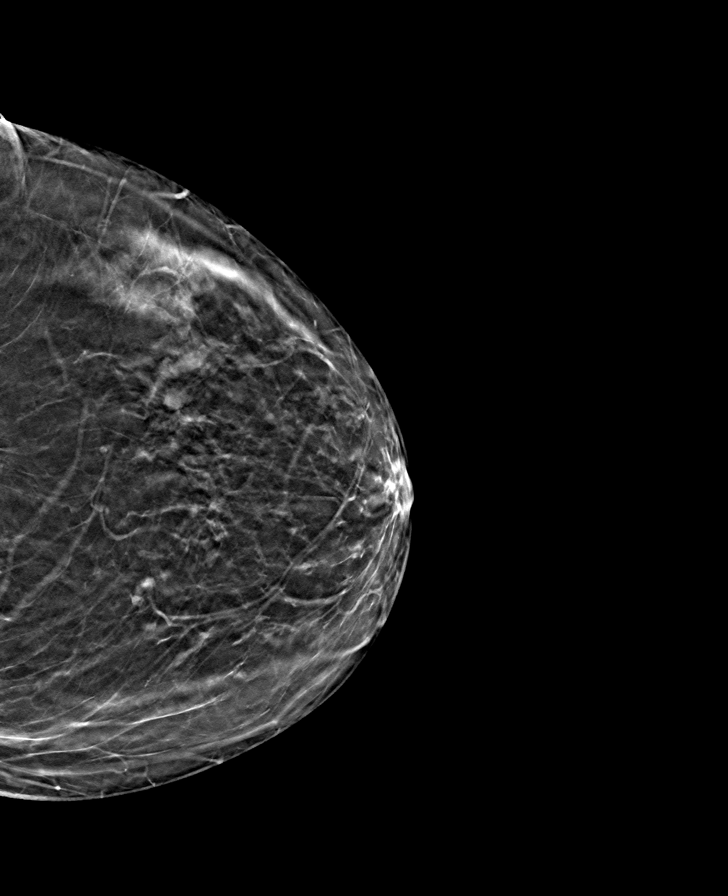

[L MLO tomo · tomo slice 33/65.0]
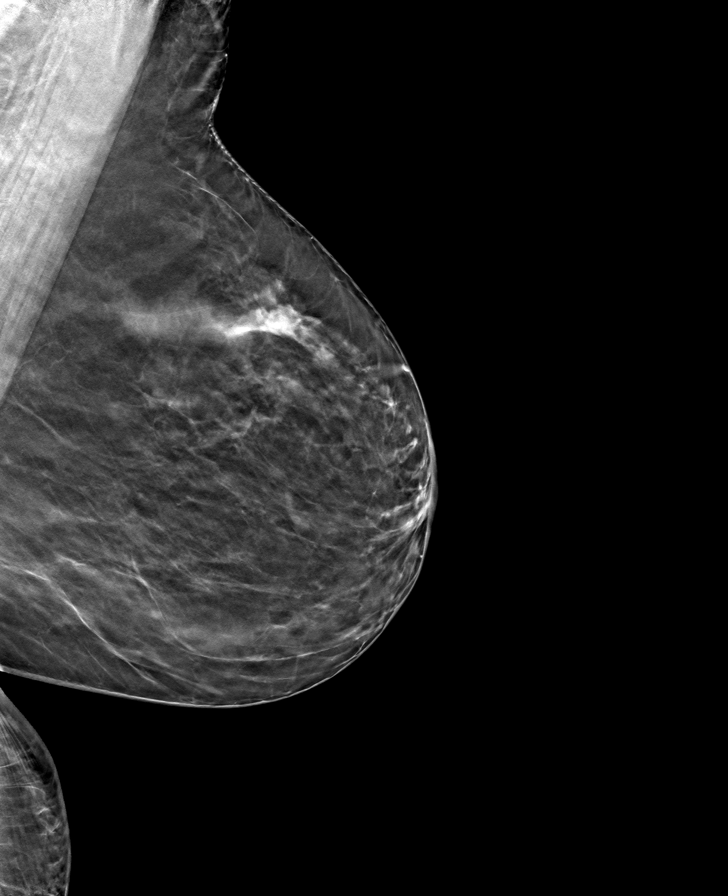

[R MLO tomo · tomo slice 37/73.0]
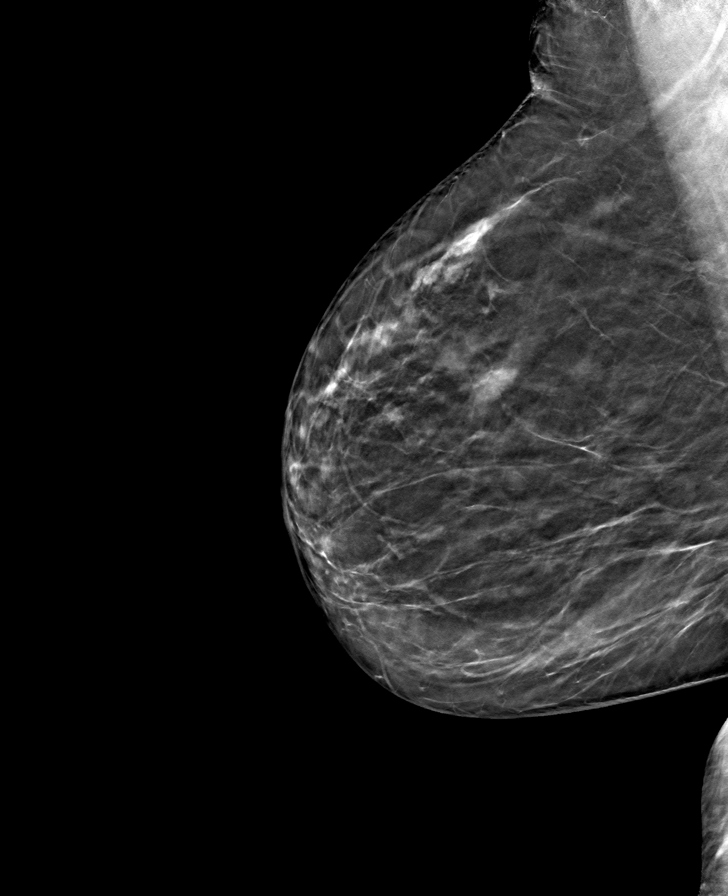

[R CC tomo · tomo slice 29/56.0]
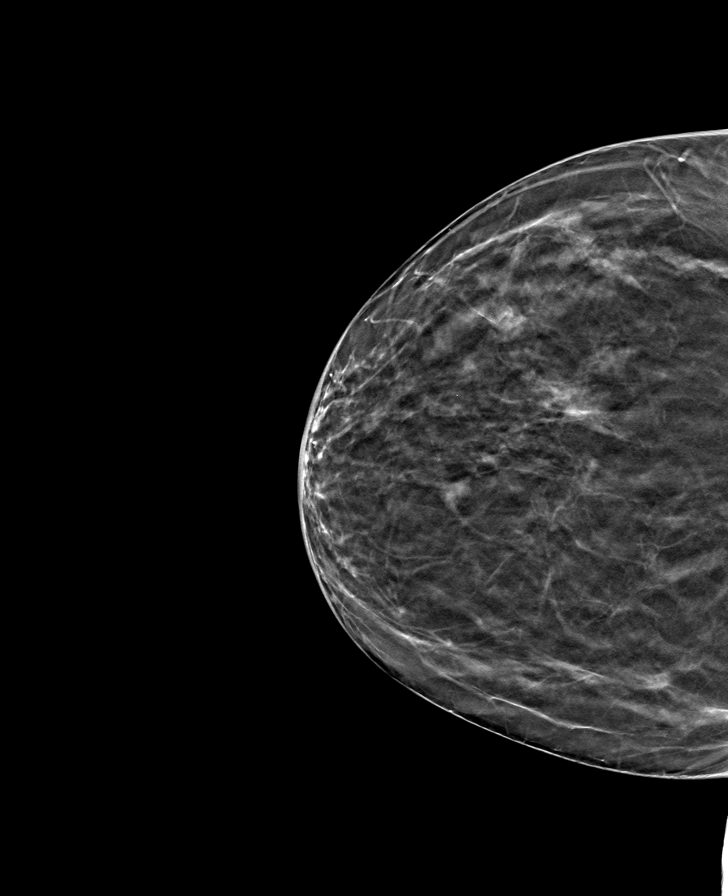

[8 of 24 positions shown; findings below may reference images not displayed]

ACR Breast Density Category b: There are scattered areas of
fibroglandular density.
FINDINGS: In the right breast, a possible asymmetry warrants further
evaluation. In the left breast, no findings suspicious for
malignancy. Images were processed with CAD.
IMPRESSION: Further evaluation is suggested for possible asymmetry in the right
breast.

RECOMMENDATION:
Diagnostic mammogram and possibly ultrasound of the right breast.
(Code:[id number])

The patient will be contacted regarding the findings, and additional
imaging will be scheduled.

BI-RADS CATEGORY  0: Incomplete. Need additional imaging evaluation
and/or prior mammograms for comparison.

## 2019-08-09 ENCOUNTER — Other Ambulatory Visit: Payer: Self-pay | Admitting: Family Medicine

## 2019-08-10 NOTE — Telephone Encounter (Signed)
ERx 

## 2019-08-15 ENCOUNTER — Other Ambulatory Visit: Payer: Self-pay | Admitting: Family Medicine

## 2019-08-15 NOTE — Telephone Encounter (Signed)
Plz schedule wellness and lab visits.  Then return encounter to me for refills.  

## 2019-08-15 NOTE — Telephone Encounter (Signed)
Noted.  E-scribed refills.  

## 2019-08-15 NOTE — Telephone Encounter (Signed)
Called and got patient scheduled for AWV and labs.

## 2019-08-22 ENCOUNTER — Other Ambulatory Visit: Payer: Self-pay

## 2019-08-22 ENCOUNTER — Telehealth: Payer: Self-pay

## 2019-08-22 ENCOUNTER — Other Ambulatory Visit (INDEPENDENT_AMBULATORY_CARE_PROVIDER_SITE_OTHER): Payer: Medicare Other

## 2019-08-22 ENCOUNTER — Ambulatory Visit: Payer: Medicare Other

## 2019-08-22 DIAGNOSIS — I10 Essential (primary) hypertension: Secondary | ICD-10-CM | POA: Diagnosis not present

## 2019-08-22 DIAGNOSIS — R21 Rash and other nonspecific skin eruption: Secondary | ICD-10-CM

## 2019-08-22 DIAGNOSIS — E785 Hyperlipidemia, unspecified: Secondary | ICD-10-CM

## 2019-08-22 LAB — COMPREHENSIVE METABOLIC PANEL
ALT: 12 U/L (ref 0–35)
AST: 23 U/L (ref 0–37)
Albumin: 4.2 g/dL (ref 3.5–5.2)
Alkaline Phosphatase: 74 U/L (ref 39–117)
BUN: 16 mg/dL (ref 6–23)
CO2: 31 mEq/L (ref 19–32)
Calcium: 9.4 mg/dL (ref 8.4–10.5)
Chloride: 103 mEq/L (ref 96–112)
Creatinine, Ser: 0.95 mg/dL (ref 0.40–1.20)
GFR: 57.88 mL/min — ABNORMAL LOW (ref >60.00)
Glucose, Bld: 117 mg/dL — ABNORMAL HIGH (ref 70–99)
Potassium: 4.4 mEq/L (ref 3.5–5.1)
Sodium: 140 mEq/L (ref 135–145)
Total Bilirubin: 0.4 mg/dL (ref 0.2–1.2)
Total Protein: 7.1 g/dL (ref 6.0–8.3)

## 2019-08-22 LAB — LIPID PANEL
Cholesterol: 235 mg/dL — ABNORMAL HIGH (ref 0–200)
HDL: 46.7 mg/dL (ref >39.00)
LDL Cholesterol: 164 mg/dL — ABNORMAL HIGH (ref 0–99)
NonHDL: 188.42
Total CHOL/HDL Ratio: 5
Triglycerides: 123 mg/dL (ref 0.0–149.0)
VLDL: 24.6 mg/dL (ref 0.0–40.0)

## 2019-08-22 LAB — CBC WITH DIFFERENTIAL/PLATELET
Basophils Absolute: 0.2 10*3/uL — ABNORMAL HIGH (ref 0.0–0.1)
Basophils Relative: 3.1 % — ABNORMAL HIGH (ref 0.0–3.0)
Eosinophils Absolute: 0.4 10*3/uL (ref 0.0–0.7)
Eosinophils Relative: 7.5 % — ABNORMAL HIGH (ref 0.0–5.0)
HCT: 44.9 % (ref 36.0–46.0)
Hemoglobin: 14.9 g/dL (ref 12.0–15.0)
Lymphocytes Relative: 35 % (ref 12.0–46.0)
Lymphs Abs: 2 10*3/uL (ref 0.7–4.0)
MCHC: 33.3 g/dL (ref 30.0–36.0)
MCV: 87.6 fl (ref 78.0–100.0)
Monocytes Absolute: 0.4 10*3/uL (ref 0.1–1.0)
Monocytes Relative: 7.4 % (ref 3.0–12.0)
Neutro Abs: 2.7 10*3/uL (ref 1.4–7.7)
Neutrophils Relative %: 47 % (ref 43.0–77.0)
Platelets: 302 10*3/uL (ref 150.0–400.0)
RBC: 5.13 Mil/uL — ABNORMAL HIGH (ref 3.87–5.11)
RDW: 13.9 % (ref 11.5–15.5)
WBC: 5.6 10*3/uL (ref 4.0–10.5)

## 2019-08-22 LAB — MICROALBUMIN / CREATININE URINE RATIO
Creatinine,U: 149.6 mg/dL
Microalb Creat Ratio: 0.7 mg/g (ref 0.0–30.0)
Microalb, Ur: 1 mg/dL (ref 0.0–1.9)

## 2019-08-22 NOTE — Telephone Encounter (Signed)
FYI- Called patient 3 times trying to complete her Medicare Wellness visit. Patient never answered. Left message notifying patient that appointment will be cancelled and she can call to reschedule or complete at upcoming physical.

## 2019-08-29 ENCOUNTER — Other Ambulatory Visit: Payer: Self-pay

## 2019-08-29 ENCOUNTER — Ambulatory Visit (INDEPENDENT_AMBULATORY_CARE_PROVIDER_SITE_OTHER): Payer: Medicare Other | Admitting: Family Medicine

## 2019-08-29 ENCOUNTER — Encounter: Payer: Self-pay | Admitting: Family Medicine

## 2019-08-29 VITALS — BP 130/78 | HR 97 | Temp 97.5°F | Ht 61.75 in | Wt 164.6 lb

## 2019-08-29 DIAGNOSIS — R21 Rash and other nonspecific skin eruption: Secondary | ICD-10-CM

## 2019-08-29 DIAGNOSIS — M85859 Other specified disorders of bone density and structure, unspecified thigh: Secondary | ICD-10-CM

## 2019-08-29 DIAGNOSIS — F4320 Adjustment disorder, unspecified: Secondary | ICD-10-CM

## 2019-08-29 DIAGNOSIS — I10 Essential (primary) hypertension: Secondary | ICD-10-CM

## 2019-08-29 DIAGNOSIS — Z23 Encounter for immunization: Secondary | ICD-10-CM | POA: Diagnosis not present

## 2019-08-29 DIAGNOSIS — Z01419 Encounter for gynecological examination (general) (routine) without abnormal findings: Secondary | ICD-10-CM

## 2019-08-29 DIAGNOSIS — Z0000 Encounter for general adult medical examination without abnormal findings: Secondary | ICD-10-CM | POA: Diagnosis not present

## 2019-08-29 DIAGNOSIS — Z1211 Encounter for screening for malignant neoplasm of colon: Secondary | ICD-10-CM

## 2019-08-29 DIAGNOSIS — Z7189 Other specified counseling: Secondary | ICD-10-CM

## 2019-08-29 DIAGNOSIS — J4520 Mild intermittent asthma, uncomplicated: Secondary | ICD-10-CM

## 2019-08-29 DIAGNOSIS — E785 Hyperlipidemia, unspecified: Secondary | ICD-10-CM

## 2019-08-29 MED ORDER — AMLODIPINE BESYLATE 2.5 MG PO TABS: 2.5000 mg | ORAL_TABLET | Freq: Every day | ORAL | 3 refills | Status: AC

## 2019-08-29 MED ORDER — FLOVENT HFA 110 MCG/ACT IN AERO: 2.0000 | INHALATION_SPRAY | Freq: Two times a day (BID) | RESPIRATORY_TRACT | 3 refills | Status: AC

## 2019-08-29 MED ORDER — VITAMIN D3 25 MCG (1000 UT) PO CAPS: 1.0000 | ORAL_CAPSULE | Freq: Every day | ORAL | Status: DC

## 2019-08-29 MED ORDER — ESCITALOPRAM OXALATE 10 MG PO TABS: 10.0000 mg | ORAL_TABLET | Freq: Every day | ORAL | 3 refills | Status: AC

## 2019-08-29 MED ORDER — PREDNISONE 20 MG PO TABS: ORAL_TABLET | ORAL | 0 refills | Status: AC

## 2019-08-29 NOTE — Assessment & Plan Note (Signed)
Continues lexapro with benefit.

## 2019-08-29 NOTE — Assessment & Plan Note (Signed)
Chronic, stable. Continue current regimen. 

## 2019-08-29 NOTE — Assessment & Plan Note (Signed)
Chronic, off meds. Reviewed diet choices to improve LDL levels.  The 10-year ASCVD risk score Denman George DC Montez Hageman., et al., 2013) is: 15.3%   Values used to calculate the score:     Age: 74 years     Sex: Female     Is Non-Hispanic African American: No     Diabetic: No     Tobacco smoker: No     Systolic Blood Pressure: 130 mmHg     Is BP treated: Yes     HDL Cholesterol: 46.7 mg/dL     Total Cholesterol: 235 mg/dL

## 2019-08-29 NOTE — Progress Notes (Signed)
This visit was conducted in person.  BP 130/78 (BP Location: Left Arm, Patient Position: Sitting, Cuff Size: Normal)   Pulse 97   Temp (!) 97.5 F (36.4 C) (Temporal)   Ht 5' 1.75" (1.568 m)   Wt 164 lb 9.6 oz (74.7 kg)   SpO2 96%   BMI 30.35 kg/m    CC: AMW/CPE Subjective:    Patient ID: Breanna Miller, female    DOB: 1945/08/19, 74 y.o.   MRN: [id number]  HPI: Breanna Miller is a 74 y.o. female presenting on 08/29/2019 for Annual Exam (Pt would like for Dr to take a look at rt ankle rash.  Pt UTD on eye exam.)   Son with prostate cancer doing ok.  Did not see health advisor this year - unable to reach.    Hearing Screening   125Hz  250Hz  500Hz  1000Hz  2000Hz  3000Hz  4000Hz  6000Hz  8000Hz   Right ear:   40 0 0  0    Left ear:   40 0 0  0    She hasn't noted worsening hearing loss.    Clinical Support from 04/06/2018 in [facility] at [facility] Total Score  1      Fall Risk  04/06/2018 02/10/2017 02/10/2017  Falls in the past year? 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 Never fully went away. Did not respond to antifungal treatment. Itchy rash. May be spreading. Always R anterior lower leg. Rash started 12/2018.   COPD vs longstanding asthma followed by Dr last seen 03/2019 - mild-mod obstruction on spirometry - on singulair, albuterol PRN and flonase/claritin. Last few weeks increased RAD flare - using albuterol inhaler 2-3 times a day, new cough with this.   Preventative: Colon cancer screening - iFOB normal 03/2017. rpt  Breast cancer screening - completed 07/2018 [facility] at [place] [facility] - none recently in last 10+ yrs. Discussed GYN cancers. Agrees to GYN referral  DEXA 06/2018 - T score -1.2 spine, -1.4 hip (osteopenia). Discussed calcium, vit D and regular weight bearing exercise. Good calcium in the diet.  Flu shot yearly  Td 2014  prevnar - 05/2018. Pneumovax  today COVID vaccine - has decided to decline. Husband did get Moderna.  Shingrix - discussed, declines  Advanced planning - does not have this. Family aware of her wishes. Would want sons (2nd then oldest) to be HCPOA. Packet previously provided - again today.  Seat belt use discussed Sunscreen use discussed. No changing moles on skin.  Non smoker.  Alcohol - seldom  Dentist yearly  Eye exam yearly  Bowel - no constipation Bladder - some stress incontinence - discussed kegel exercises   Caffeine: 2-3 cups coffee/day Lives with husband 04/12/2017), 2 dogs Occupation: retired, was 01/2019 in [employer]: HS  Activity: stays active on farm ([place] Diet: good water, fruits/vegetables daily     Relevant past medical, surgical, family and social history reviewed and updated as indicated. Interim medical history since our last visit reviewed. Allergies and medications reviewed and updated. Outpatient Medications Prior to Visit  Medication Sig Dispense Refill  . albuterol (PROVENTIL HFA;VENTOLIN HFA) 108 (90 BASE) MCG/ACT inhaler Inhale 2 puffs into the lungs every 6 (six) hours as needed for wheezing. 1 Inhaler 11  . LORazepam (ATIVAN) 0.5 MG tablet TAKE 1 TABLET BY MOUTH AT BEDTIME AS NEEDED FOR SLEEP 30 tablet 1  . montelukast (SINGULAIR) 10  MG tablet Take 10 mg by mouth at bedtime.    . triamcinolone cream (KENALOG) 0.1 % Apply 1 application topically 2 (two) times daily. Apply to AA. 80 g 1  . amLODipine (NORVASC) 2.5 MG tablet TAKE 1 TABLET BY MOUTH DAILY. 30 tablet 0  . escitalopram (LEXAPRO) 10 MG tablet TAKE 1 TABLET BY MOUTH DAILY. 30 tablet 0   No facility-administered medications prior to visit.     Per HPI unless specifically indicated in ROS section below Review of Systems  Constitutional: Negative for activity change, appetite change, chills, fatigue, fever and unexpected weight change.  HENT: Negative for hearing loss.   Eyes: Negative for visual  disturbance.  Respiratory: Positive for shortness of breath (related to copd/asthma). Negative for cough, chest tightness and wheezing.   Cardiovascular: Negative for chest pain, palpitations and leg swelling.  Gastrointestinal: Negative for abdominal distention, abdominal pain, blood in stool, constipation, diarrhea, nausea and vomiting.  Genitourinary: Negative for difficulty urinating and hematuria.  Musculoskeletal: Negative for arthralgias, myalgias and neck pain.  Skin: Negative for rash.  Neurological: Negative for dizziness, seizures, syncope and headaches (eye strain).  Hematological: Negative for adenopathy. Does not bruise/bleed easily.  Psychiatric/Behavioral: Negative for dysphoric mood. The patient is not nervous/anxious.    Objective:    BP 130/78 (BP Location: Left Arm, Patient Position: Sitting, Cuff Size: Normal)   Pulse 97   Temp (!) 97.5 F (36.4 C) (Temporal)   Ht 5' 1.75" (1.568 m)   Wt 164 lb 9.6 oz (74.7 kg)   SpO2 96%   BMI 30.35 kg/m   Wt Readings from Last 3 Encounters:  08/29/19 164 lb 9.6 oz (74.7 kg)  12/19/18 157 lb (71.2 kg)  05/22/18 160 lb 4 oz (72.7 kg)    Physical Exam Vitals and nursing note reviewed.  Constitutional:      General: She is not in acute distress.    Appearance: Normal appearance. She is well-developed. She is not ill-appearing.  HENT:     Head: Normocephalic and atraumatic.     Right Ear: Hearing, tympanic membrane, ear canal and external ear normal.     Left Ear: Hearing, tympanic membrane, ear canal and external ear normal.     Mouth/Throat:     Pharynx: Uvula midline.  Eyes:     General: No scleral icterus.    Extraocular Movements: Extraocular movements intact.     Conjunctiva/sclera: Conjunctivae normal.     Pupils: Pupils are equal, round, and reactive to light.  Cardiovascular:     Rate and Rhythm: Normal rate and regular rhythm.     Pulses: Normal pulses.          Radial pulses are 2+ on the right side and 2+ on  the left side.     Heart sounds: Normal heart sounds. No murmur.  Pulmonary:     Effort: Pulmonary effort is normal. No respiratory distress.     Breath sounds: Wheezing (polysymphonic wheezing) present. No rhonchi or rales.  Abdominal:     General: Abdomen is flat. Bowel sounds are normal. There is no distension.     Palpations: Abdomen is soft. There is no mass.     Tenderness: There is no abdominal tenderness. There is no guarding or rebound.     Hernia: No hernia is present.  Musculoskeletal:        General: Normal range of motion.     Cervical back: Normal range of motion and neck supple.     Right lower  leg: No edema.     Left lower leg: No edema.  Lymphadenopathy:     Cervical: No cervical adenopathy.  Skin:    General: Skin is warm and dry.     Capillary Refill: Capillary refill takes 2 to 3 seconds.     Findings: Erythema and rash present.     Comments: Blanching erythematous patch anterior lower leg with central scaling  Neurological:     General: No focal deficit present.     Mental Status: She is alert and oriented to person, place, and time.     Comments:  CN grossly intact, station and gait intact  Recall 3/3  Calculation 5/5 DLROW   Psychiatric:        Mood and Affect: Mood normal.        Behavior: Behavior normal.        Thought Content: Thought content normal.        Judgment: Judgment normal.       Results for orders placed or performed in visit on 08/22/19  CBC with Differential  Result Value Ref Range   WBC 5.6 4.0 - 10.5 K/uL   RBC 5.13 (H) 3.87 - 5.11 Mil/uL   Hemoglobin 14.9 12.0 - 15.0 g/dL   HCT 24.2 35.3 - 61.4 %   MCV 87.6 78.0 - 100.0 fl   MCHC 33.3 30.0 - 36.0 g/dL   RDW 43.1 54.0 - 08.6 %   Platelets 302.0 150.0 - 400.0 K/uL   Neutrophils Relative % 47.0 43.0 - 77.0 %   Lymphocytes Relative 35.0 12.0 - 46.0 %   Monocytes Relative 7.4 3.0 - 12.0 %   Eosinophils Relative 7.5 (H) 0.0 - 5.0 %   Basophils Relative 3.1 (H) 0.0 - 3.0 %    Neutro Abs 2.7 1.4 - 7.7 K/uL   Lymphs Abs 2.0 0.7 - 4.0 K/uL   Monocytes Absolute 0.4 0.1 - 1.0 K/uL   Eosinophils Absolute 0.4 0.0 - 0.7 K/uL   Basophils Absolute 0.2 (H) 0.0 - 0.1 K/uL  Lipid panel  Result Value Ref Range   Cholesterol 235 (H) 0 - 200 mg/dL   Triglycerides 761.9 0.0 - 149.0 mg/dL   HDL 50.93 >26.71 mg/dL   VLDL 24.5 0.0 - 80.9 mg/dL   LDL Cholesterol 983 (H) 0 - 99 mg/dL   Total CHOL/HDL Ratio 5    NonHDL 188.42   Comprehensive metabolic panel  Result Value Ref Range   Sodium 140 135 - 145 mEq/L   Potassium 4.4 3.5 - 5.1 mEq/L   Chloride 103 96 - 112 mEq/L   CO2 31 19 - 32 mEq/L   Glucose, Bld 117 (H) 70 - 99 mg/dL   BUN 16 6 - 23 mg/dL   Creatinine, Ser 3.82 0.40 - 1.20 mg/dL   Total Bilirubin 0.4 0.2 - 1.2 mg/dL   Alkaline Phosphatase 74 39 - 117 U/L   AST 23 0 - 37 U/L   ALT 12 0 - 35 U/L   Total Protein 7.1 6.0 - 8.3 g/dL   Albumin 4.2 3.5 - 5.2 g/dL   GFR 50.53 (L) >97.67 mL/min   Calcium 9.4 8.4 - 10.5 mg/dL  Microalbumin / creatinine urine ratio  Result Value Ref Range   Microalb, Ur 1.0 0.0 - 1.9 mg/dL   Creatinine,U 341.9 mg/dL   Microalb Creat Ratio 0.7 0.0 - 30.0 mg/g   Assessment & Plan:  This visit occurred during the SARS-CoV-2 public health emergency.  Safety protocols were in place, including  screening questions prior to the visit, additional usage of staff PPE, and extensive cleaning of exam room while observing appropriate contact time as indicated for disinfecting solutions.   Problem List Items Addressed This Visit    Skin rash    Recurrent, comes and goes. Anticipate eczematous dermatitis. Given persistent, desires derm eval - referral placed.      Relevant Orders   Ambulatory referral to Dermatology   Osteopenia    Reviewed last year's DEXA. Reviewed calcium vit D intake and recommended regular weight bearing exercises. She has started vit D OTC daily. It sounds like she is getting recommended 1200mg  calcium/day in diet.        Mild intermittent asthma    Sees pulm Dr for this - upcoming appt next month. On albuterol PRN with singulair, flonase, antihistamine.  Currently wheezing noted - in mild asthma flare - will Rx flovent inhaler to use during allergy season, provided with WASP for prednisone in case not improving with this.  Noted mild relative eosinophilia      Relevant Medications   predniSONE (DELTASONE) 20 MG tablet   fluticasone (FLOVENT HFA) 110 MCG/ACT inhaler   Medicare annual wellness visit, subsequent - Primary    I have personally reviewed the Medicare Annual Wellness questionnaire and have noted 1. The patient's medical and social history 2. Their use of alcohol, tobacco or illicit drugs 3. Their current medications and supplements 4. The patient's functional ability including ADL's, fall risks, home safety risks and hearing or visual impairment. Cognitive function has been assessed and addressed as indicated.  5. Diet and physical activity 6. Evidence for depression or mood disorders The patients weight, height, BMI have been recorded in the chart. I have made referrals, counseling and provided education to the patient based on review of the above and I have provided the pt with a written personalized care plan for preventive services. Provider list updated.. See scanned questionairre as needed for further documentation. Reviewed preventative protocols and updated unless pt declined.       Hypertension    Chronic, stable. Continue current regimen.       Relevant Medications   amLODipine (NORVASC) 2.5 MG tablet   Hyperlipidemia, mild    Chronic, off meds. Reviewed diet choices to improve LDL levels.  The 10-year ASCVD risk score [employer] Denman George., et al., 2013) is: 15.3%   Values used to calculate the score:     Age: 55 years     Sex: Female     Is Non-Hispanic African American: No     Diabetic: No     Tobacco smoker: No     Systolic Blood Pressure: 130 mmHg     Is BP treated:  Yes     HDL Cholesterol: 46.7 mg/dL     Total Cholesterol: 235 mg/dL       Relevant Medications   amLODipine (NORVASC) 2.5 MG tablet   Health maintenance examination    Preventative protocols reviewed and updated unless pt declined. Discussed healthy diet and lifestyle.  Reviewed GYN screenings - agrees to GYN referral given no well woman exam in over 10 years.       Advanced care planning/counseling discussion    Advanced planning - does not have this. Family aware of her wishes. Would want sons (2nd then oldest) to be HCPOA. Packet previously provided - again today.       Adjustment disorder    Continues lexapro with benefit.        Other  Visit Diagnoses    Special screening for malignant neoplasms, colon       Relevant Orders   Fecal occult blood, imunochemical   Well woman exam       Relevant Orders   Ambulatory referral to Gynecology   Need for 23-polyvalent pneumococcal polysaccharide vaccine       Relevant Orders   Pneumococcal polysaccharide vaccine 23-valent greater than or equal to 2yo subcutaneous/IM (Completed)       Meds ordered this encounter  Medications  . Cholecalciferol (VITAMIN D3) 25 MCG (1000 UT) CAPS    Sig: Take 1 capsule (1,000 Units total) by mouth daily.    Dispense:  30 capsule  . amLODipine (NORVASC) 2.5 MG tablet    Sig: Take 1 tablet (2.5 mg total) by mouth daily.    Dispense:  90 tablet    Refill:  3  . escitalopram (LEXAPRO) 10 MG tablet    Sig: Take 1 tablet (10 mg total) by mouth daily.    Dispense:  90 tablet    Refill:  3  . predniSONE (DELTASONE) 20 MG tablet    Sig: Take two tablets daily for 3 days followed by one tablet daily for 3 days    Dispense:  10 tablet    Refill:  0  . fluticasone (FLOVENT HFA) 110 MCG/ACT inhaler    Sig: Inhale 2 puffs into the lungs in the morning and at bedtime. For spring season    Dispense:  1 Inhaler    Refill:  3   Orders Placed This Encounter  Procedures  . Fecal occult blood,  imunochemical    Standing Status:   Future    Standing Expiration Date:   08/28/2020  . Pneumococcal polysaccharide vaccine 23-valent greater than or equal to 2yo subcutaneous/IM  . Ambulatory referral to Dermatology    Referral Priority:   Routine    Referral Type:   Consultation    Referral Reason:   Specialty Services Required    Requested Specialty:   Dermatology    Number of Visits Requested:   1  . Ambulatory referral to Gynecology    Referral Priority:   Routine    Referral Type:   Consultation    Referral Reason:   Specialty Services Required    Requested Specialty:   Gynecology    Number of Visits Requested:   1    Patient instructions: Pneumovax today  We will refer you to the dermatologist for ongoing skin rash.  We will refer you to GYN for well woman exam.  I think asthma is flaring a bit - start daily controller medicine steroid inhaler - 2 puffs twice daily, if doing well drop to 1 puff twice daily. If ongoing wheezing despite this fill prednisone taper printed out today.  Return as needed or in 1 year for next physical/wellness visit.   Follow up plan: Return in about 1 year (around 08/28/2020) for annual exam, prior fasting for blood work, medicare wellness visit.  Ria Bush, MD

## 2019-08-29 NOTE — Assessment & Plan Note (Addendum)
Sees pulm Dr [facility] [employer] for this - upcoming appt next month. On albuterol PRN with singulair, flonase, antihistamine.  Currently wheezing noted - in mild asthma flare - will Rx flovent inhaler to use during allergy season, provided with WASP for prednisone in case not improving with this.  Noted mild relative eosinophilia

## 2019-08-29 NOTE — Assessment & Plan Note (Addendum)
Reviewed last year's DEXA. Reviewed calcium vit D intake and recommended regular weight bearing exercises. She has started vit D OTC daily. It sounds like she is getting recommended 1200mg  calcium/day in diet.

## 2019-08-29 NOTE — Assessment & Plan Note (Signed)
Advanced planning - does not have this. Family aware of her wishes. Would want sons (2nd then oldest) to be HCPOA. Packet previously provided - again today.

## 2019-08-29 NOTE — Assessment & Plan Note (Signed)

## 2019-08-29 NOTE — Patient Instructions (Addendum)
Pneumovax today  Pass by lab to pick up stool kit We will refer you to the dermatologist for ongoing skin rash.  We will refer you to GYN for well woman exam.  I think asthma is flaring a bit - start daily controller medicine steroid inhaler - 2 puffs twice daily, if doing well drop to 1 puff twice daily. If ongoing wheezing despite this fill prednisone taper printed out today.  Return as needed or in 1 year for next physical/wellness visit.   Health Maintenance After Age 58 After age 48, you are at a higher risk for certain long-term diseases and infections as well as injuries from falls. Falls are a major cause of broken bones and head injuries in people who are older than age 108. Getting regular preventive care can help to keep you healthy and well. Preventive care includes getting regular testing and making lifestyle changes as recommended by your health care provider. Talk with your health care provider about:  Which screenings and tests you should have. A screening is a test that checks for a disease when you have no symptoms.  A diet and exercise plan that is right for you. What should I know about screenings and tests to prevent falls? Screening and testing are the best ways to find a health problem early. Early diagnosis and treatment give you the best chance of managing medical conditions that are common after age 82. Certain conditions and lifestyle choices may make you more likely to have a fall. Your health care provider may recommend:  Regular vision checks. Poor vision and conditions such as cataracts can make you more likely to have a fall. If you wear glasses, make sure to get your prescription updated if your vision changes.  Medicine review. Work with your health care provider to regularly review all of the medicines you are taking, including over-the-counter medicines. Ask your health care provider about any side effects that may make you more likely to have a fall. Tell your  health care provider if any medicines that you take make you feel dizzy or sleepy.  Osteoporosis screening. Osteoporosis is a condition that causes the bones to get weaker. This can make the bones weak and cause them to break more easily.  Blood pressure screening. Blood pressure changes and medicines to control blood pressure can make you feel dizzy.  Strength and balance checks. Your health care provider may recommend certain tests to check your strength and balance while standing, walking, or changing positions.  Foot health exam. Foot pain and numbness, as well as not wearing proper footwear, can make you more likely to have a fall.  Depression screening. You may be more likely to have a fall if you have a fear of falling, feel emotionally low, or feel unable to do activities that you used to do.  Alcohol use screening. Using too much alcohol can affect your balance and may make you more likely to have a fall. What actions can I take to lower my risk of falls? General instructions  Talk with your health care provider about your risks for falling. Tell your health care provider if: ? You fall. Be sure to tell your health care provider about all falls, even ones that seem minor. ? You feel dizzy, sleepy, or off-balance.  Take over-the-counter and prescription medicines only as told by your health care provider. These include any supplements.  Eat a healthy diet and maintain a healthy weight. A healthy diet includes low-fat dairy products,  low-fat (lean) meats, and fiber from whole grains, beans, and lots of fruits and vegetables. Home safety  Remove any tripping hazards, such as rugs, cords, and clutter.  Install safety equipment such as grab bars in bathrooms and safety rails on stairs.  Keep rooms and walkways well-lit. Activity   Follow a regular exercise program to stay fit. This will help you maintain your balance. Ask your health care provider what types of exercise are  appropriate for you.  If you need a cane or walker, use it as recommended by your health care provider.  Wear supportive shoes that have nonskid soles. Lifestyle  Do not drink alcohol if your health care provider tells you not to drink.  If you drink alcohol, limit how much you have: ? 0-1 drink a day for women. ? 0-2 drinks a day for men.  Be aware of how much alcohol is in your drink. In the [place] one drink equals one typical bottle of beer (12 oz), one-half glass of wine (5 oz), or one shot of hard liquor (1 oz).  Do not use any products that contain nicotine or tobacco, such as cigarettes and e-cigarettes. If you need help quitting, ask your health care provider. Summary  Having a healthy lifestyle and getting preventive care can help to protect your health and wellness after age 24.  Screening and testing are the best way to find a health problem early and help you avoid having a fall. Early diagnosis and treatment give you the best chance for managing medical conditions that are more common for people who are older than age 91.  Falls are a major cause of broken bones and head injuries in people who are older than age 52. Take precautions to prevent a fall at home.  Work with your health care provider to learn what changes you can make to improve your health and wellness and to prevent falls. This information is not intended to replace advice given to you by your health care provider. Make sure you discuss any questions you have with your health care provider. Document Revised: 09/14/2018 Document Reviewed: 04/06/2017 Elsevier Patient Education  2020 [employer].

## 2019-08-29 NOTE — Assessment & Plan Note (Signed)
Recurrent, comes and goes. Anticipate eczematous dermatitis. Given persistent, desires derm eval - referral placed.

## 2019-08-29 NOTE — Assessment & Plan Note (Addendum)
Preventative protocols reviewed and updated unless pt declined. Discussed healthy diet and lifestyle.  Reviewed GYN screenings - agrees to GYN referral given no well woman exam in over 10 years.

## 2019-08-30 ENCOUNTER — Ambulatory Visit: Payer: Medicare Other | Admitting: Dermatology

## 2019-08-30 DIAGNOSIS — R21 Rash and other nonspecific skin eruption: Secondary | ICD-10-CM

## 2019-08-30 MED ORDER — FLUOCINONIDE 0.05 % EX OINT: 1.0000 "application " | TOPICAL_OINTMENT | Freq: Every day | CUTANEOUS | 0 refills | Status: AC

## 2019-08-30 NOTE — Progress Notes (Signed)
   New Patient Visit  Subjective  Breanna Miller is a 74 y.o. female who presents for the following: Rash -  referred by PCP Dr. Eustaquio Boyden (R lower leg, since 12/2018, hx of itching and pain, used TMC 0.1% cr and cleared, then flared again and did not clear with TMC 0.1% cr so d/c.  Using Crisco prn ). There is no personal or family history of psoriasis the patient is aware of.  Objective  Well appearing patient in no apparent distress; mood and affect are within normal limits.  A focused examination was performed including R ankle. Relevant physical exam findings are noted in the Assessment and Plan.  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  Wound cleansed with Puracyn today, followed by Mupirocin oint, telfa and coban.  fluocinonide ointment (LIDEX) 0.05 % - Right Ankle - Anterior  Return in about 3 weeks (around 09/20/2019) for recheck Psoriasis vs Eczema with lichenification.   I, Ardis Rowan, RMA, am acting as scribe for Armida Sans, MD .

## 2019-09-05 ENCOUNTER — Encounter: Payer: Self-pay | Admitting: Family Medicine

## 2019-09-06 ENCOUNTER — Other Ambulatory Visit (INDEPENDENT_AMBULATORY_CARE_PROVIDER_SITE_OTHER): Payer: Medicare Other

## 2019-09-06 DIAGNOSIS — Z1211 Encounter for screening for malignant neoplasm of colon: Secondary | ICD-10-CM

## 2019-09-06 LAB — FECAL OCCULT BLOOD, IMMUNOCHEMICAL: Fecal Occult Bld: NEGATIVE

## 2019-09-06 MED ORDER — VITAMIN D 50 MCG (2000 UT) PO CAPS: 1.0000 | ORAL_CAPSULE | Freq: Every day | ORAL | Status: AC

## 2019-09-26 ENCOUNTER — Encounter: Payer: Self-pay | Admitting: Family Medicine

## 2019-09-26 ENCOUNTER — Ambulatory Visit: Payer: Medicare Other | Admitting: Dermatology

## 2019-10-11 DIAGNOSIS — J439 Emphysema, unspecified: Secondary | ICD-10-CM | POA: Diagnosis not present

## 2019-10-11 DIAGNOSIS — R0600 Dyspnea, unspecified: Secondary | ICD-10-CM | POA: Diagnosis not present

## 2019-10-22 DIAGNOSIS — N132 Hydronephrosis with renal and ureteral calculous obstruction: Secondary | ICD-10-CM | POA: Diagnosis not present

## 2019-10-22 DIAGNOSIS — K6389 Other specified diseases of intestine: Secondary | ICD-10-CM | POA: Diagnosis not present

## 2020-09-10 ENCOUNTER — Telehealth: Payer: Self-pay

## 2020-09-10 NOTE — Telephone Encounter (Signed)
Received a refill request for patient from [facility] in [place]. Patient stated that she has moved down there permanently and has a PCP down there. Will disregard this.

## 2023-01-12 NOTE — Telephone Encounter (Signed)
Dr.Gutierrez removed as PCP.
